# Patient Record
Sex: Female | Born: 1990 | Race: White | Hispanic: No | Marital: Married | Smoking: Never smoker
Health system: Southern US, Community
[De-identification: ages and names within clinical notes are randomized; demographics above are authoritative.]

## PROBLEM LIST (undated history)

## (undated) DIAGNOSIS — J45909 Unspecified asthma, uncomplicated: Secondary | ICD-10-CM

## (undated) DIAGNOSIS — Z9049 Acquired absence of other specified parts of digestive tract: Secondary | ICD-10-CM

## (undated) HISTORY — PX: TONSILLECTOMY: SUR1361

## (undated) HISTORY — PX: BREAST SURGERY: SHX581

## (undated) HISTORY — PX: CHOLECYSTECTOMY: SHX55

## (undated) HISTORY — DX: Acquired absence of other specified parts of digestive tract: Z90.49

## (undated) HISTORY — PX: APPENDECTOMY: SHX54

---
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
## 2008-09-20 ENCOUNTER — Encounter: Payer: Self-pay | Admitting: Maternal and Fetal Medicine

## 2008-11-11 ENCOUNTER — Ambulatory Visit: Payer: Self-pay | Admitting: Internal Medicine

## 2008-12-24 ENCOUNTER — Observation Stay: Payer: Self-pay

## 2009-03-22 ENCOUNTER — Inpatient Hospital Stay: Payer: Self-pay | Admitting: Obstetrics and Gynecology

## 2010-05-04 ENCOUNTER — Emergency Department (HOSPITAL_COMMUNITY): Admission: EM | Admit: 2010-05-04 | Discharge: 2010-05-04 | Payer: Self-pay | Admitting: Family Medicine

## 2011-07-12 ENCOUNTER — Ambulatory Visit: Payer: Self-pay

## 2012-02-15 ENCOUNTER — Ambulatory Visit: Payer: Self-pay | Admitting: Emergency Medicine

## 2012-06-21 ENCOUNTER — Ambulatory Visit: Payer: Self-pay | Admitting: Internal Medicine

## 2013-01-31 ENCOUNTER — Ambulatory Visit: Payer: Self-pay | Admitting: Emergency Medicine

## 2013-01-31 LAB — URINALYSIS, COMPLETE
Glucose,UR: NEGATIVE mg/dL (ref 0–75)
Ketone: NEGATIVE
RBC,UR: >30 /HPF (ref 0–5)
Specific Gravity: 1.02 (ref 1.003–1.030)
WBC UR: >30 /HPF (ref 0–5)

## 2013-01-31 LAB — PREGNANCY, URINE: Pregnancy Test, Urine: NEGATIVE m[IU]/mL

## 2013-02-02 LAB — URINE CULTURE

## 2013-08-06 ENCOUNTER — Observation Stay: Payer: Self-pay

## 2013-08-06 LAB — URINALYSIS, COMPLETE
Bacteria: NONE SEEN
Bilirubin,UR: NEGATIVE
Glucose,UR: NEGATIVE mg/dL (ref 0–75)
Ketone: NEGATIVE
Leukocyte Esterase: NEGATIVE
NITRITE: NEGATIVE
Ph: 6 (ref 4.5–8.0)
Protein: 100
RBC,UR: 5124 /HPF (ref 0–5)
SQUAMOUS EPITHELIAL: NONE SEEN
Specific Gravity: 1.026 (ref 1.003–1.030)

## 2013-08-08 LAB — URINE CULTURE

## 2014-04-17 ENCOUNTER — Ambulatory Visit: Payer: Self-pay | Admitting: Physician Assistant

## 2014-08-03 ENCOUNTER — Ambulatory Visit: Payer: Self-pay | Admitting: Internal Medicine

## 2015-06-26 ENCOUNTER — Ambulatory Visit
Admission: EM | Admit: 2015-06-26 | Discharge: 2015-06-26 | Disposition: A | Discharge status: Home / Self Care | Payer: Self-pay | Attending: Family Medicine | Admitting: Family Medicine

## 2015-06-26 ENCOUNTER — Encounter: Payer: Self-pay | Admitting: Emergency Medicine

## 2015-06-26 DIAGNOSIS — J209 Acute bronchitis, unspecified: Secondary | ICD-10-CM

## 2015-06-26 MED ORDER — AZITHROMYCIN 250 MG PO TABS: ORAL_TABLET | ORAL | Status: DC

## 2015-06-26 MED ORDER — ALBUTEROL SULFATE HFA 108 (90 BASE) MCG/ACT IN AERS: 1.0000 | INHALATION_SPRAY | Freq: Four times a day (QID) | RESPIRATORY_TRACT | Status: DC | PRN

## 2015-06-26 NOTE — ED Provider Notes (Signed)
Patient presents today with symptoms of nasal congestion, sore throat, productive cough (green mucus) chest discomfort with coughing, hoarseness of voice for the last 3-4 days. Patient's mother has pneumonia. Patient denies any fever but has had questionable chills. Denies any history of asthma or COPD. Denies history of smoking. Patient denies any abdominal pain, nausea, vomiting, diarrhea. Has been taking some old Cheratussin which hasn't helped much.  ROS: Negative except mentioned above.  Vitals as per Epic. GENERAL: NAD HEENT: mild pharyngeal erythema, no exudate, no erythema of TMs, no cervical LAD RESP: CTA B, no accessory muscle use CARD: RRR NEURO: CN II-XII grossly intact   A/P: Bronchitis- Will treat with Albuterol when necessary, Zithromax, Delsym when necessary, oral antihistamine when necessary, seek medical attention if symptoms persist or worsen as discussed.   Jolene ProvostKirtida Masato Pettie, MD 06/26/15 [phone]

## 2015-06-26 NOTE — ED Notes (Signed)
Pt reports nasal congestion, sore throat, chest pain, cough, hoarse voice started 3 days ago. Her mother has pneumonia. Little bit of chills yesterday, denies fever.

## 2015-06-26 NOTE — ED Notes (Signed)
Pt doesn't have insurance so hopes for inexpensive meds.

## 2015-08-07 ENCOUNTER — Ambulatory Visit
Admission: EM | Admit: 2015-08-07 | Discharge: 2015-08-07 | Disposition: A | Discharge status: Home / Self Care | Payer: Self-pay | Attending: Family Medicine | Admitting: Family Medicine

## 2015-08-07 ENCOUNTER — Encounter: Payer: Self-pay | Admitting: Gynecology

## 2015-08-07 DIAGNOSIS — J018 Other acute sinusitis: Secondary | ICD-10-CM

## 2015-08-07 DIAGNOSIS — J069 Acute upper respiratory infection, unspecified: Secondary | ICD-10-CM

## 2015-08-07 LAB — URINALYSIS COMPLETE WITH MICROSCOPIC (ARMC ONLY)
Bilirubin Urine: NEGATIVE
GLUCOSE, UA: NEGATIVE mg/dL
HGB URINE DIPSTICK: NEGATIVE
Ketones, ur: NEGATIVE mg/dL
LEUKOCYTES UA: NEGATIVE
NITRITE: NEGATIVE
Protein, ur: NEGATIVE mg/dL
SPECIFIC GRAVITY, URINE: 1.025 (ref 1.005–1.030)
pH: 6 (ref 5.0–8.0)

## 2015-08-07 LAB — RAPID STREP SCREEN (MED CTR MEBANE ONLY): Streptococcus, Group A Screen (Direct): NEGATIVE

## 2015-08-07 LAB — PREGNANCY, URINE: PREG TEST UR: NEGATIVE

## 2015-08-07 MED ORDER — FLUTICASONE PROPIONATE 50 MCG/ACT NA SUSP: 1.0000 | Freq: Every day | NASAL | Status: DC

## 2015-08-07 MED ORDER — SULFAMETHOXAZOLE-TRIMETHOPRIM 800-160 MG PO TABS: 1.0000 | ORAL_TABLET | Freq: Two times a day (BID) | ORAL | Status: AC

## 2015-08-07 NOTE — ED Notes (Addendum)
Patient c/o seen on 06/26/15 dx with mild bronchitis. Pt.now with cough/ sore throat / burning urination with cramping. Pt. Stated right eye redness/ crust / and drainage x couple days.

## 2015-08-07 NOTE — ED Provider Notes (Signed)
Patient presents today with symptoms of mild sore throat, mild productive cough, urinary frequency and slight dysuria, itchy watery eyes bilaterally for the last few days (no mattiness or discharge). Patient has had recurring symptoms listed over the last few months. She states her symptoms get better with treatment and then come back. He is unsure whether there is an allergen that is contributing to her symptoms. She denies any severe headache, fever, nausea, vomiting, diarrhea, chest pain, shortness of breath. She denies any smoking history.  ROS: Negative except mentioned above.  Vitals as per Epic.  GENERAL: NAD HEENT: mild pharyngeal erythema, no exudate, no erythema of TMs, mild cerumen bilaterally, no cervical LAD, mild frontal sinus tenderness bilaterally, minimal erythema of conjunctiva, no discharge or mattiness noted  RESP: CTA B CARD: RRR ABD: +BS, NT/ND, no rebound or guarding, no flank tenderness NEURO: CN II-XII grossly intact   A/P: URI, Sinusitis- Will treat with Bactrim DS, Flonase when necessary, Claritin prn, Tylenol/Ibuprofen when necessary, Delsym when necessary, rest, hydration. Discussed with patient that I would recommend seeing her primary care physician within the week to do any further workup regarding her recurrent illnesses. She may have allergy related symptoms and may need allergy testing. If any worsening symptoms she is to seek immediate medical attention as discussed.  Jolene ProvostKirtida Shaneeka Scarboro, MD 08/07/15 [phone]

## 2015-08-09 LAB — CULTURE, GROUP A STREP (THRC)

## 2015-08-09 LAB — URINE CULTURE

## 2015-11-11 ENCOUNTER — Ambulatory Visit (INDEPENDENT_AMBULATORY_CARE_PROVIDER_SITE_OTHER): Payer: Self-pay | Admitting: Family Medicine

## 2015-11-11 ENCOUNTER — Ambulatory Visit: Payer: Self-pay | Admitting: Family Medicine

## 2015-11-11 ENCOUNTER — Encounter: Payer: Self-pay | Admitting: Family Medicine

## 2015-11-11 VITALS — BP 100/78 | HR 80 | Temp 97.9°F | Ht 67.0 in | Wt 179.0 lb

## 2015-11-11 DIAGNOSIS — J40 Bronchitis, not specified as acute or chronic: Secondary | ICD-10-CM

## 2015-11-11 DIAGNOSIS — J0100 Acute maxillary sinusitis, unspecified: Secondary | ICD-10-CM

## 2015-11-11 MED ORDER — ALBUTEROL SULFATE HFA 108 (90 BASE) MCG/ACT IN AERS: 2.0000 | INHALATION_SPRAY | Freq: Four times a day (QID) | RESPIRATORY_TRACT | Status: DC | PRN

## 2015-11-11 MED ORDER — GUAIFENESIN-CODEINE 100-10 MG/5ML PO SYRP: 5.0000 mL | ORAL_SOLUTION | Freq: Three times a day (TID) | ORAL | Status: DC | PRN

## 2015-11-11 MED ORDER — AMOXICILLIN 500 MG PO CAPS: 500.0000 mg | ORAL_CAPSULE | Freq: Three times a day (TID) | ORAL | Status: DC

## 2015-11-11 NOTE — Progress Notes (Signed)
Name: Maria Zavala   MRN: [id number]    DOB: Nov 04, 1990   Date:11/11/2015       Progress Note  Subjective  Chief Complaint  Chief Complaint  Patient presents with  . Sinusitis    cough and cong- wheezing with green production    Sinusitis This is a new problem. The current episode started 1 to 4 weeks ago. The problem has been waxing and waning since onset. There has been no fever. She is experiencing no pain. Associated symptoms include chills, congestion, coughing, diaphoresis, headaches, a hoarse voice, shortness of breath and sinus pressure. Pertinent negatives include no ear pain, neck pain, sneezing, sore throat or swollen glands. Past treatments include oral decongestants. The treatment provided no relief.  Cough This is a new problem. The current episode started more than 1 year ago. The problem has been waxing and waning. The cough is productive of purulent sputum. Associated symptoms include chest pain, chills, headaches, nasal congestion, postnasal drip and shortness of breath. Pertinent negatives include no ear pain, fever, heartburn, myalgias, rash, sore throat, weight loss or wheezing. The symptoms are aggravated by pollens. She has tried a beta-agonist inhaler for the symptoms. The treatment provided mild relief. There is no history of asthma or environmental allergies.    No problem-specific assessment & plan notes found for this encounter.   History reviewed. No pertinent past medical history.  Past Surgical History  Procedure Laterality Date  . Tonsillectomy    . Breast surgery      fibroid removed  . Cholecystectomy      History reviewed. No pertinent family history.  Social History   Social History  . Marital Status: Single    Spouse Name: N/A  . Number of Children: N/A  . Years of Education: N/A   Occupational History  . Not on file.   Social History Main Topics  . Smoking status: Never Smoker   . Smokeless tobacco: Not on file  . Alcohol Use:  Yes  . Drug Use: Not on file  . Sexual Activity: Yes   Other Topics Concern  . Not on file   Social History Narrative    Allergies  Allergen Reactions  . Adhesive [Tape] Rash     Review of Systems  Constitutional: Positive for chills and diaphoresis. Negative for fever, weight loss and malaise/fatigue.  HENT: Positive for congestion, hoarse voice, postnasal drip and sinus pressure. Negative for ear discharge, ear pain, sneezing and sore throat.   Eyes: Negative for blurred vision.  Respiratory: Positive for cough and shortness of breath. Negative for sputum production and wheezing.   Cardiovascular: Positive for chest pain. Negative for palpitations and leg swelling.  Gastrointestinal: Negative for heartburn, nausea, abdominal pain, diarrhea, constipation, blood in stool and melena.  Genitourinary: Negative for dysuria, urgency, frequency and hematuria.  Musculoskeletal: Negative for myalgias, back pain, joint pain and neck pain.  Skin: Negative for rash.  Neurological: Positive for headaches. Negative for dizziness, tingling, sensory change and focal weakness.  Endo/Heme/Allergies: Negative for environmental allergies and polydipsia. Does not bruise/bleed easily.  Psychiatric/Behavioral: Negative for depression and suicidal ideas. The patient is not nervous/anxious and does not have insomnia.      Objective  Filed Vitals:   11/11/15 1351  BP: 100/78  Pulse: 80  Temp: 97.9 F (36.6 C)  TempSrc: Oral  Height: 5\' 7"  (1.702 m)  Weight: 179 lb (81.194 kg)  SpO2: 99%    Physical Exam  Constitutional: She is well-developed, well-nourished, and in  no distress. No distress.  HENT:  Head: Normocephalic and atraumatic.  Right Ear: External ear normal.  Left Ear: External ear normal.  Nose: Nose normal.  Mouth/Throat: Oropharynx is clear and moist.  Eyes: Conjunctivae and EOM are normal. Pupils are equal, round, and reactive to light. Right eye exhibits no discharge. Left  eye exhibits no discharge.  Neck: Normal range of motion. Neck supple. No JVD present. No thyromegaly present.  Cardiovascular: Normal rate, regular rhythm, normal heart sounds and intact distal pulses.  Exam reveals no gallop and no friction rub.   No murmur heard. Pulmonary/Chest: Effort normal and breath sounds normal.  Abdominal: Soft. Bowel sounds are normal. She exhibits no mass. There is no tenderness. There is no guarding.  Musculoskeletal: Normal range of motion. She exhibits no edema.  Lymphadenopathy:    She has no cervical adenopathy.  Neurological: She is alert. She has normal reflexes.  Skin: Skin is warm and dry. She is not diaphoretic.  Psychiatric: Mood and affect normal.  Nursing note and vitals reviewed.     Assessment & Plan  Problem List Items Addressed This Visit    None    Visit Diagnoses    Acute maxillary sinusitis, recurrence not specified    -  Primary    Relevant Medications    amoxicillin (AMOXIL) 500 MG capsule    guaiFENesin-codeine (ROBITUSSIN AC) 100-10 MG/5ML syrup    Bronchitis        symbicort sample    Relevant Medications    albuterol (PROVENTIL HFA;VENTOLIN HFA) 108 (90 Base) MCG/ACT inhaler    amoxicillin (AMOXIL) 500 MG capsule    guaiFENesin-codeine (ROBITUSSIN AC) 100-10 MG/5ML syrup         Dr. [facility]  11/11/2015

## 2015-11-11 NOTE — Patient Instructions (Signed)
How to Use an Inhaler °Proper inhaler technique is very important. Good technique ensures that the medicine reaches the lungs. Poor technique results in depositing the medicine on the tongue and back of the throat rather than in the airways. If you do not use the inhaler with good technique, the medicine will not help you. °STEPS TO FOLLOW IF USING AN INHALER WITHOUT AN EXTENSION TUBE °1. Remove the cap from the inhaler. °2. If you are using the inhaler for the first time, you will need to prime it. Shake the inhaler for 5 seconds and release four puffs into the air, away from your face. Ask your health care provider or pharmacist if you have questions about priming your inhaler. °3. Shake the inhaler for 5 seconds before each breath in (inhalation). °4. Position the inhaler so that the top of the canister faces up. °5. Put your index finger on the top of the medicine canister. Your thumb supports the bottom of the inhaler. °6. Open your mouth. °7. Either place the inhaler between your teeth and place your lips tightly around the mouthpiece, or hold the inhaler 1-2 inches away from your open mouth. If you are unsure of which technique to use, ask your health care provider. °8. Breathe out (exhale) normally and as completely as possible. °9. Press the canister down with your index finger to release the medicine. °10. At the same time as the canister is pressed, inhale deeply and slowly until your lungs are completely filled. This should take 4-6 seconds. Keep your tongue down. °11. Hold the medicine in your lungs for 5-10 seconds (10 seconds is best). This helps the medicine get into the small airways of your lungs. °12. Breathe out slowly, through pursed lips. Whistling is an example of pursed lips. °13. Wait at least 15-30 seconds between puffs. Continue with the above steps until you have taken the number of puffs your health care provider has ordered. Do not use the inhaler more than your health care provider  tells you. °14. Replace the cap on the inhaler. °15. Follow the directions from your health care provider or the inhaler insert for cleaning the inhaler. °STEPS TO FOLLOW IF USING AN INHALER WITH AN EXTENSION (SPACER) °1. Remove the cap from the inhaler. °2. If you are using the inhaler for the first time, you will need to prime it. Shake the inhaler for 5 seconds and release four puffs into the air, away from your face. Ask your health care provider or pharmacist if you have questions about priming your inhaler. °3. Shake the inhaler for 5 seconds before each breath in (inhalation). °4. Place the open end of the spacer onto the mouthpiece of the inhaler. °5. Position the inhaler so that the top of the canister faces up and the spacer mouthpiece faces you. °6. Put your index finger on the top of the medicine canister. Your thumb supports the bottom of the inhaler and the spacer. °7. Breathe out (exhale) normally and as completely as possible. °8. Immediately after exhaling, place the spacer between your teeth and into your mouth. Close your lips tightly around the spacer. °9. Press the canister down with your index finger to release the medicine. °10. At the same time as the canister is pressed, inhale deeply and slowly until your lungs are completely filled. This should take 4-6 seconds. Keep your tongue down and out of the way. °11. Hold the medicine in your lungs for 5-10 seconds (10 seconds is best). This helps the   medicine get into the small airways of your lungs. Exhale. °12. Repeat inhaling deeply through the spacer mouthpiece. Again hold that breath for up to 10 seconds (10 seconds is best). Exhale slowly. If it is difficult to take this second deep breath through the spacer, breathe normally several times through the spacer. Remove the spacer from your mouth. °13. Wait at least 15-30 seconds between puffs. Continue with the above steps until you have taken the number of puffs your health care provider has  ordered. Do not use the inhaler more than your health care provider tells you. °14. Remove the spacer from the inhaler, and place the cap on the inhaler. °15. Follow the directions from your health care provider or the inhaler insert for cleaning the inhaler and spacer. °If you are using different kinds of inhalers, use your quick relief medicine to open the airways 10-15 minutes before using a steroid if instructed to do so by your health care provider. If you are unsure which inhalers to use and the order of using them, ask your health care provider, nurse, or respiratory therapist. °If you are using a steroid inhaler, always rinse your mouth with water after your last puff, then gargle and spit out the water. Do not swallow the water. °AVOID: °· Inhaling before or after starting the spray of medicine. It takes practice to coordinate your breathing with triggering the spray. °· Inhaling through the nose (rather than the mouth) when triggering the spray. °HOW TO DETERMINE IF YOUR INHALER IS FULL OR NEARLY EMPTY °You cannot know when an inhaler is empty by shaking it. A few inhalers are now being made with dose counters. Ask your health care provider for a prescription that has a dose counter if you feel you need that extra help. If your inhaler does not have a counter, ask your health care provider to help you determine the date you need to refill your inhaler. Write the refill date on a calendar or your inhaler canister. Refill your inhaler 7-10 days before it runs out. Be sure to keep an adequate supply of medicine. This includes making sure it is not expired, and that you have a spare inhaler.  °SEEK MEDICAL CARE IF:  °· Your symptoms are only partially relieved with your inhaler. °· You are having trouble using your inhaler. °· You have some increase in phlegm. °SEEK IMMEDIATE MEDICAL CARE IF:  °· You feel little or no relief with your inhalers. You are still wheezing and are feeling shortness of breath or  tightness in your chest or both. °· You have dizziness, headaches, or a fast heart rate. °· You have chills, fever, or night sweats. °· You have a noticeable increase in phlegm production, or there is blood in the phlegm. °MAKE SURE YOU:  °· Understand these instructions. °· Will watch your condition. °· Will get help right away if you are not doing well or get worse. °  °This information is not intended to replace advice given to you by your health care provider. Make sure you discuss any questions you have with your health care provider. °  °Document Released: 07/20/2000 Document Revised: 05/13/2013 Document Reviewed: 02/19/2013 °Elsevier Interactive Patient Education ©2016 [employer] ° °

## 2016-01-02 ENCOUNTER — Encounter: Payer: Self-pay | Admitting: *Deleted

## 2016-01-02 ENCOUNTER — Ambulatory Visit
Admission: EM | Admit: 2016-01-02 | Discharge: 2016-01-02 | Disposition: A | Discharge status: Home / Self Care | Payer: Self-pay | Attending: Family Medicine | Admitting: Family Medicine

## 2016-01-02 ENCOUNTER — Ambulatory Visit (INDEPENDENT_AMBULATORY_CARE_PROVIDER_SITE_OTHER): Payer: Self-pay

## 2016-01-02 DIAGNOSIS — J189 Pneumonia, unspecified organism: Secondary | ICD-10-CM

## 2016-01-02 DIAGNOSIS — J181 Lobar pneumonia, unspecified organism: Principal | ICD-10-CM

## 2016-01-02 MED ORDER — LEVOFLOXACIN 500 MG PO TABS: 500.0000 mg | ORAL_TABLET | Freq: Every day | ORAL | Status: DC

## 2016-01-02 MED ORDER — GUAIFENESIN-CODEINE 100-10 MG/5ML PO SOLN: ORAL | Status: DC

## 2016-01-02 MED ORDER — IPRATROPIUM-ALBUTEROL 0.5-2.5 (3) MG/3ML IN SOLN
3.0000 mL | Freq: Once | RESPIRATORY_TRACT | Status: AC
  Administered 2016-01-02: 3 mL via RESPIRATORY_TRACT

## 2016-01-02 NOTE — Discharge Instructions (Signed)

## 2016-01-02 NOTE — ED Provider Notes (Signed)
CSN: [id number]     Arrival date & time 01/02/16  0946 History   First MD Initiated Contact with Patient 01/02/16 1049     Chief Complaint  Patient presents with  . Cough  . Fever  . Shortness of Breath  . Headache  . Nasal Congestion   (Consider location/radiation/quality/duration/timing/severity/associated sxs/prior Treatment) Patient is a 25 y.o. female presenting with URI. The history is provided by the patient.  URI Presenting symptoms: congestion, cough, fatigue and fever   Severity:  Moderate Onset quality:  Sudden Duration:  1 week Timing:  Constant Progression:  Worsening Chronicity:  New Relieved by:  Nothing Ineffective treatments:  OTC medications Associated symptoms: headaches and wheezing   Risk factors: not elderly, no chronic cardiac disease, no chronic kidney disease, no diabetes mellitus, no immunosuppression, no recent illness and no recent travel     History reviewed. No pertinent past medical history. Past Surgical History  Procedure Laterality Date  . Tonsillectomy    . Breast surgery      fibroid removed  . Cholecystectomy     History reviewed. No pertinent family history. Social History  Substance Use Topics  . Smoking status: Never Smoker   . Smokeless tobacco: None  . Alcohol Use: Yes   OB History    No data available     Review of Systems  Constitutional: Positive for fever and fatigue.  HENT: Positive for congestion.   Respiratory: Positive for cough and wheezing.   Neurological: Positive for headaches.    Allergies  Adhesive  Home Medications   Prior to Admission medications   Medication Sig Start Date End Date Taking? Authorizing Provider  amoxicillin (AMOXIL) 500 MG capsule Take 1 capsule (500 mg total) by mouth 3 (three) times daily. 11/11/15  Yes Duanne Limerickeanna C Jones, MD  budesonide-formoterol (SYMBICORT) 160-4.5 MCG/ACT inhaler Inhale 2 puffs into the lungs 2 (two) times daily.   Yes Historical Provider, MD  albuterol (PROVENTIL  HFA;VENTOLIN HFA) 108 (90 BASE) MCG/ACT inhaler Inhale 1-2 puffs into the lungs every 6 (six) hours as needed for wheezing or shortness of breath. 06/26/15   Jolene ProvostKirtida Patel, MD  albuterol (PROVENTIL HFA;VENTOLIN HFA) 108 (90 Base) MCG/ACT inhaler Inhale 2 puffs into the lungs every 6 (six) hours as needed for wheezing or shortness of breath. 11/11/15   Duanne Limerickeanna C Jones, MD  guaiFENesin-codeine 100-10 MG/5ML syrup 10 ml po q 8 hours prn 01/02/16   Payton Mccallumrlando Pernie Grosso, MD  levofloxacin (LEVAQUIN) 500 MG tablet Take 1 tablet (500 mg total) by mouth daily. 01/02/16   Payton Mccallumrlando Jalayia Bagheri, MD   Meds Ordered and Administered this Visit   Medications  ipratropium-albuterol (DUONEB) 0.5-2.5 (3) MG/3ML nebulizer solution 3 mL (3 mLs Nebulization Given 01/02/16 1058)    BP 110/75 mmHg  Pulse 122  Temp(Src) 99.1 F (37.3 C) (Oral)  Resp 20  Ht 5\' 7"  (1.702 m)  Wt 180 lb (81.647 kg)  BMI 28.19 kg/m2  SpO2 95%  LMP 12/26/2015 (Approximate) No data found.   Physical Exam  Constitutional: She appears well-developed and well-nourished. No distress.  HENT:  Head: Normocephalic and atraumatic.  Right Ear: Tympanic membrane, external ear and ear canal normal.  Left Ear: Tympanic membrane, external ear and ear canal normal.  Nose: Mucosal edema and rhinorrhea present. No nose lacerations, sinus tenderness, nasal deformity, septal deviation or nasal septal hematoma. No epistaxis.  No foreign bodies. Right sinus exhibits maxillary sinus tenderness and frontal sinus tenderness. Left sinus exhibits maxillary sinus tenderness and frontal sinus tenderness.  Mouth/Throat: Uvula is midline, oropharynx is clear and moist and mucous membranes are normal. No oropharyngeal exudate.  Eyes: Conjunctivae and EOM are normal. Pupils are equal, round, and reactive to light. Right eye exhibits no discharge. Left eye exhibits no discharge. No scleral icterus.  Neck: Normal range of motion. Neck supple. No thyromegaly present.  Cardiovascular:  Normal rate, regular rhythm and normal heart sounds.   Pulmonary/Chest: Effort normal. No respiratory distress. She has wheezes (diffuse expiratory). She has rales (left base).  Lymphadenopathy:    She has no cervical adenopathy.  Skin: No rash noted. She is not diaphoretic.  Nursing note and vitals reviewed.   ED Course  Procedures (including critical care time)  Labs Review Labs Reviewed - No data to display  Imaging Review Dg Chest 2 View  01/02/2016  CLINICAL DATA:  Cough and right-sided chest pain EXAM: CHEST  2 VIEW COMPARISON:  None. FINDINGS: Cardiac shadow is within normal limits. The lungs are well aerated bilaterally. Minimal left basilar infiltrate is seen. No bony abnormality is noted. No sizable effusion is seen. IMPRESSION: Minimal left basilar infiltrate. Electronically Signed   By: Alcide Clever M.D.   On: 01/02/2016 11:34     Visual Acuity Review  Right Eye Distance:   Left Eye Distance:   Bilateral Distance:    Right Eye Near:   Left Eye Near:    Bilateral Near:         MDM   1. Left lower lobe pneumonia    Discharge Medication List as of 01/02/2016 11:47 AM    START taking these medications   Details  levofloxacin (LEVAQUIN) 500 MG tablet Take 1 tablet (500 mg total) by mouth daily., Starting 01/02/2016, Until Discontinued, Normal       1. x-ray results and diagnosis reviewed with patient; patient given duoneb treatment x 1 with improvement of symptoms; recheck 02 sat 95-97% on RA sustained 2. rx as per orders above; reviewed possible side effects, interactions, risks and benefits; continue current home medications 3. Recommend supportive treatment with fluids, rest 4. Follow-up prn if symptoms worsen or don't improve    Payton Mccallum, MD 01/02/16 1845

## 2016-01-02 NOTE — ED Notes (Signed)
Productive cough- green, fever, dyspnea, headache, runny nose, head congestion, x1 week.

## 2016-10-24 ENCOUNTER — Ambulatory Visit
Admission: EM | Admit: 2016-10-24 | Discharge: 2016-10-24 | Disposition: A | Discharge status: Home / Self Care | Payer: Managed Care, Other (non HMO) | Attending: Family Medicine | Admitting: Family Medicine

## 2016-10-24 ENCOUNTER — Encounter: Payer: Self-pay | Admitting: *Deleted

## 2016-10-24 DIAGNOSIS — J4520 Mild intermittent asthma, uncomplicated: Secondary | ICD-10-CM | POA: Diagnosis not present

## 2016-10-24 HISTORY — DX: Unspecified asthma, uncomplicated: J45.909

## 2016-10-24 MED ORDER — HYDROCOD POLST-CPM POLST ER 10-8 MG/5ML PO SUER: 5.0000 mL | Freq: Two times a day (BID) | ORAL | 0 refills | Status: DC | PRN

## 2016-10-24 MED ORDER — ALBUTEROL SULFATE HFA 108 (90 BASE) MCG/ACT IN AERS: 1.0000 | INHALATION_SPRAY | Freq: Four times a day (QID) | RESPIRATORY_TRACT | 0 refills | Status: DC | PRN

## 2016-10-24 MED ORDER — AZITHROMYCIN 250 MG PO TABS: ORAL_TABLET | ORAL | 0 refills | Status: DC

## 2016-10-24 MED ORDER — PREDNISONE 20 MG PO TABS: 20.0000 mg | ORAL_TABLET | Freq: Every day | ORAL | 0 refills | Status: DC

## 2016-10-24 NOTE — ED Triage Notes (Signed)
Patient has had chronic right upper chest pain that started 2 years ago. Today the pain radiated to her back and started to cough while singing at church.

## 2016-10-24 NOTE — ED Provider Notes (Signed)
[facility]    CSN: [id number] Arrival date & time: 10/24/16  1946     History   Chief Complaint Chief Complaint  Patient presents with  . Cough  . Nasal Congestion  . Chest Pain    HPI Maria Zavala is a 26 y.o. female.    Cough  Associated symptoms: chest pain, rhinorrhea and wheezing   Associated symptoms: no headaches   Chest Pain  Pain location:  R lateral chest Pain quality: stabbing   Pain radiates to:  Upper back Pain severity:  Moderate Timing:  Intermittent Progression:  Waxing and waning Chronicity:  Chronic Associated symptoms: cough   Associated symptoms: no headache   URI  Presenting symptoms: congestion, cough and rhinorrhea   Severity:  Moderate Onset quality:  Sudden Duration:  2 weeks Timing:  Constant Chronicity:  New Relieved by:  Nothing Ineffective treatments:  OTC medications Associated symptoms: wheezing   Associated symptoms: no headaches, no neck pain and no sinus pain   Risk factors: chronic respiratory disease (asthma)   Risk factors: not elderly, no chronic cardiac disease, no chronic kidney disease, no diabetes mellitus, no immunosuppression, no recent illness, no recent travel and no sick contacts     Past Medical History:  Diagnosis Date  . Asthma     There are no active problems to display for this patient.   Past Surgical History:  Procedure Laterality Date  . BREAST SURGERY     fibroid removed  . CHOLECYSTECTOMY    . TONSILLECTOMY      OB History    No data available       Home Medications    Prior to Admission medications   Medication Sig Start Date End Date Taking? Authorizing Provider  albuterol (PROVENTIL HFA;VENTOLIN HFA) 108 (90 Base) MCG/ACT inhaler Inhale 1-2 puffs into the lungs every 6 (six) hours as needed for wheezing or shortness of breath. 10/24/16   Payton Mccallum, MD  amoxicillin (AMOXIL) 500 MG capsule Take 1 capsule (500 mg total) by mouth 3 (three) times daily. 11/11/15   Duanne Limerick, MD  azithromycin (ZITHROMAX Z-PAK) 250 MG tablet 2 tabs po once day 1, then 1 tab po qd for the next 4 days 10/24/16   Payton Mccallum, MD  budesonide-formoterol [facility]) 160-4.5 MCG/ACT inhaler Inhale 2 puffs into the lungs 2 (two) times daily.    Historical Provider, MD  chlorpheniramine-HYDROcodone (TUSSIONEX PENNKINETIC ER) 10-8 MG/5ML SUER Take 5 mLs by mouth every 12 (twelve) hours as needed. 10/24/16   Payton Mccallum, MD  guaiFENesin-codeine 100-10 MG/5ML syrup 10 ml po q 8 hours prn 01/02/16   Payton Mccallum, MD  levofloxacin (LEVAQUIN) 500 MG tablet Take 1 tablet (500 mg total) by mouth daily. 01/02/16   Payton Mccallum, MD  predniSONE (DELTASONE) 20 MG tablet Take 1 tablet (20 mg total) by mouth daily. 10/24/16   Payton Mccallum, MD    Family History History reviewed. No pertinent family history.  Social History Social History  Substance Use Topics  . Smoking status: Never Smoker  . Smokeless tobacco: Never Used  . Alcohol use Yes     Allergies   Adhesive [tape]   Review of Systems Review of Systems  HENT: Positive for congestion and rhinorrhea. Negative for sinus pain.   Respiratory: Positive for cough and wheezing.   Cardiovascular: Positive for chest pain.  Musculoskeletal: Negative for neck pain.  Neurological: Negative for headaches.     Physical Exam Triage Vital Signs ED Triage Vitals  Enc Vitals Group     BP 10/24/16 2000 111/86     Pulse Rate 10/24/16 2000 94     Resp 10/24/16 2000 16     Temp 10/24/16 2000 98.9 F (37.2 C)     Temp Source 10/24/16 2000 Oral     SpO2 10/24/16 2000 100 %     Weight 10/24/16 2001 194 lb (88 kg)     Height 10/24/16 2001 5\' 7"  (1.702 m)     Head Circumference --      Peak Flow --      Pain Score 10/24/16 2003 8     Pain Loc --      Pain Edu? --      Excl. in GC? --    No data found.   Updated Vital Signs BP 111/86 (BP Location: Left Arm)   Pulse 94   Temp 98.9 F (37.2 C) (Oral)   Resp 16   Ht 5\' 7"  (1.702  m)   Wt 194 lb (88 kg)   LMP 09/13/2016   SpO2 100%   BMI 30.38 kg/m   Visual Acuity Right Eye Distance:   Left Eye Distance:   Bilateral Distance:    Right Eye Near:   Left Eye Near:    Bilateral Near:     Physical Exam  Constitutional: She appears well-developed and well-nourished. No distress.  HENT:  Head: Normocephalic and atraumatic.  Right Ear: Tympanic membrane, external ear and ear canal normal.  Left Ear: Tympanic membrane, external ear and ear canal normal.  Nose: Mucosal edema and rhinorrhea present. No nose lacerations, sinus tenderness, nasal deformity, septal deviation or nasal septal hematoma. No epistaxis.  No foreign bodies. Right sinus exhibits maxillary sinus tenderness and frontal sinus tenderness. Left sinus exhibits maxillary sinus tenderness and frontal sinus tenderness.  Mouth/Throat: Uvula is midline, oropharynx is clear and moist and mucous membranes are normal. No oropharyngeal exudate.  Eyes: Conjunctivae and EOM are normal. Pupils are equal, round, and reactive to light. Right eye exhibits no discharge. Left eye exhibits no discharge. No scleral icterus.  Neck: Normal range of motion. Neck supple. No thyromegaly present.  Cardiovascular: Normal rate, regular rhythm and normal heart sounds.   Pulmonary/Chest: Effort normal. No respiratory distress. She has wheezes (diffuse, mild). She has no rales.  Lymphadenopathy:    She has no cervical adenopathy.  Skin: She is not diaphoretic.  Nursing note and vitals reviewed.    UC Treatments / Results  Labs (all labs ordered are listed, but only abnormal results are displayed) Labs Reviewed - No data to display  EKG  EKG Interpretation None       Radiology No results found.  Procedures Procedures (including critical care time)  Medications Ordered in UC Medications - No data to display   Initial Impression / Assessment and Plan / UC Course  I have reviewed the triage vital signs and the  nursing notes.  Pertinent labs & imaging results that were available during my care of the patient were reviewed by me and considered in my medical decision making (see chart for details).       Final Clinical Impressions(s) / UC Diagnoses   Final diagnoses:  Mild intermittent asthmatic bronchitis without complication    New Prescriptions Discharge Medication List as of 10/24/2016  8:33 PM    START taking these medications   Details  azithromycin (ZITHROMAX Z-PAK) 250 MG tablet 2 tabs po once day 1, then 1 tab po qd for the next  4 days, Normal    chlorpheniramine-HYDROcodone (TUSSIONEX PENNKINETIC ER) 10-8 MG/5ML SUER Take 5 mLs by mouth every 12 (twelve) hours as needed., Starting Wed 10/24/2016, Normal    predniSONE (DELTASONE) 20 MG tablet Take 1 tablet (20 mg total) by mouth daily., Starting Wed 10/24/2016, Normal       1. diagnosis reviewed with patient 2. rx as per orders above; reviewed possible side effects, interactions, risks and benefits  3. Recommend supportive treatment with rest, fluids 4. Follow-up prn if symptoms worsen or don't improve   Payton Mccallumrlando Cole Klugh, MD 10/24/16 2057

## 2016-11-11 DIAGNOSIS — Z9049 Acquired absence of other specified parts of digestive tract: Secondary | ICD-10-CM

## 2016-11-11 HISTORY — DX: Acquired absence of other specified parts of digestive tract: Z90.49

## 2016-11-16 ENCOUNTER — Ambulatory Visit: Payer: Self-pay | Admitting: Family Medicine

## 2017-01-03 ENCOUNTER — Encounter: Payer: Self-pay | Admitting: Family Medicine

## 2017-01-03 ENCOUNTER — Ambulatory Visit (INDEPENDENT_AMBULATORY_CARE_PROVIDER_SITE_OTHER): Payer: Managed Care, Other (non HMO) | Admitting: Family Medicine

## 2017-01-03 VITALS — BP 108/74 | HR 84 | Ht 67.0 in | Wt 193.0 lb

## 2017-01-03 DIAGNOSIS — F329 Major depressive disorder, single episode, unspecified: Secondary | ICD-10-CM

## 2017-01-03 DIAGNOSIS — F419 Anxiety disorder, unspecified: Secondary | ICD-10-CM

## 2017-01-03 MED ORDER — SERTRALINE HCL 25 MG PO TABS: 25.0000 mg | ORAL_TABLET | Freq: Every day | ORAL | 2 refills | Status: DC

## 2017-01-03 NOTE — Progress Notes (Signed)
Name: Maria Zavala   MRN: [id number]    DOB: [birth year]   Date:01/03/2017       Progress Note  Subjective  Chief Complaint  Chief Complaint  Patient presents with  . Depression    husband works 2 days a week- money issues    Patient having issues with anxiety and depression. On condoms for birth controlled   Depression         This is a new problem.  The current episode started more than 1 month ago.   The onset quality is gradual.   The problem occurs daily.  The problem has been gradually worsening since onset.  Associated symptoms include helplessness, irritable, decreased interest and sad.  Associated symptoms include no decreased concentration, no fatigue, no hopelessness, does not have insomnia, no restlessness, no appetite change, no body aches, no myalgias, no headaches, no indigestion and no suicidal ideas.     The symptoms are aggravated by family issues.  Past treatments include SSRIs - Selective serotonin reuptake inhibitors.  Compliance with treatment is good.  Past medical history includes anxiety.   Anxiety  Presents for initial visit. Patient reports no chest pain, decreased concentration, dizziness, insomnia, nausea, nervous/anxious behavior, palpitations, restlessness, shortness of breath or suicidal ideas. The severity of symptoms is moderate. The symptoms are aggravated by family issues.      No problem-specific Assessment & Plan notes found for this encounter.   Past Medical History:  Diagnosis Date  . Asthma   . Status post appendectomy 11/11/2016    Past Surgical History:  Procedure Laterality Date  . BREAST SURGERY     fibroid removed  . CHOLECYSTECTOMY    . TONSILLECTOMY      History reviewed. No pertinent family history.  Social History   Social History  . Marital status: Single    Spouse name: N/A  . Number of children: N/A  . Years of education: N/A   Occupational History  . Not on file.   Social History Main Topics  . Smoking status:  Never Smoker  . Smokeless tobacco: Never Used  . Alcohol use Yes  . Drug use: No  . Sexual activity: Yes   Other Topics Concern  . Not on file   Social History Narrative  . No narrative on file    Allergies  Allergen Reactions  . Adhesive [Tape] Rash    Outpatient Medications Prior to Visit  Medication Sig Dispense Refill  . budesonide-formoterol (SYMBICORT) 160-4.5 MCG/ACT inhaler Inhale 2 puffs into the lungs 2 (two) times daily.    Marland Kitchen albuterol (PROVENTIL HFA;VENTOLIN HFA) 108 (90 Base) MCG/ACT inhaler Inhale 1-2 puffs into the lungs every 6 (six) hours as needed for wheezing or shortness of breath. 1 Inhaler 0  . amoxicillin (AMOXIL) 500 MG capsule Take 1 capsule (500 mg total) by mouth 3 (three) times daily. 30 capsule 0  . azithromycin (ZITHROMAX Z-PAK) 250 MG tablet 2 tabs po once day 1, then 1 tab po qd for the next 4 days 6 each 0  . chlorpheniramine-HYDROcodone (TUSSIONEX PENNKINETIC ER) 10-8 MG/5ML SUER Take 5 mLs by mouth every 12 (twelve) hours as needed. 120 mL 0  . guaiFENesin-codeine 100-10 MG/5ML syrup 10 ml po q 8 hours prn 120 mL 0  . levofloxacin (LEVAQUIN) 500 MG tablet Take 1 tablet (500 mg total) by mouth daily. 7 tablet 0  . predniSONE (DELTASONE) 20 MG tablet Take 1 tablet (20 mg total) by mouth daily. 7 tablet 0   No  facility-administered medications prior to visit.     Review of Systems  Constitutional: Negative for appetite change, chills, fatigue, fever, malaise/fatigue and weight loss.  HENT: Negative for ear discharge, ear pain and sore throat.   Eyes: Negative for blurred vision.  Respiratory: Negative for cough, sputum production, shortness of breath and wheezing.   Cardiovascular: Negative for chest pain, palpitations and leg swelling.  Gastrointestinal: Negative for abdominal pain, blood in stool, constipation, diarrhea, heartburn, melena and nausea.  Genitourinary: Negative for dysuria, frequency, hematuria and urgency.  Musculoskeletal:  Negative for back pain, joint pain, myalgias and neck pain.  Skin: Negative for rash.  Neurological: Negative for dizziness, tingling, sensory change, focal weakness and headaches.  Endo/Heme/Allergies: Negative for environmental allergies and polydipsia. Does not bruise/bleed easily.  Psychiatric/Behavioral: Positive for depression. Negative for decreased concentration and suicidal ideas. The patient is not nervous/anxious and does not have insomnia.      Objective  Vitals:   01/03/17 0856  BP: 108/74  Pulse: 84  SpO2: 98%  Weight: 193 lb (87.5 kg)  Height: 5\' 7"  (1.702 m)    Physical Exam  Constitutional: She is well-developed, well-nourished, and in no distress. She is irritable. No distress.  HENT:  Head: Normocephalic and atraumatic.  Right Ear: External ear normal.  Left Ear: External ear normal.  Nose: Nose normal.  Mouth/Throat: Oropharynx is clear and moist.  Eyes: Conjunctivae and EOM are normal. Pupils are equal, round, and reactive to light. Right eye exhibits no discharge. Left eye exhibits no discharge.  Neck: Normal range of motion. Neck supple. No JVD present. No thyromegaly present.  Cardiovascular: Normal rate, regular rhythm, normal heart sounds and intact distal pulses.  Exam reveals no gallop and no friction rub.   No murmur heard. Pulmonary/Chest: Effort normal and breath sounds normal. She has no wheezes. She has no rales.  Abdominal: Soft. Bowel sounds are normal. She exhibits no mass. There is no tenderness. There is no guarding.  Musculoskeletal: Normal range of motion. She exhibits no edema.  Lymphadenopathy:    She has no cervical adenopathy.  Neurological: She is alert. She has normal reflexes.  Skin: Skin is warm and dry. She is not diaphoretic.  Psychiatric: Mood and affect normal.  Nursing note and vitals reviewed.     Assessment & Plan  Problem List Items Addressed This Visit    None    Visit Diagnoses    Anxiety and depression    -   Primary   Relevant Medications   sertraline (ZOLOFT) 25 MG tablet      Meds ordered this encounter  Medications  . sertraline (ZOLOFT) 25 MG tablet    Sig: Take 1 tablet (25 mg total) by mouth daily.    Dispense:  30 tablet    Refill:  2      Dr. Hayden Rasmusseneanna Brazil [facility]  01/03/17

## 2017-02-04 ENCOUNTER — Encounter: Payer: Self-pay | Admitting: Family Medicine

## 2017-02-04 ENCOUNTER — Ambulatory Visit (INDEPENDENT_AMBULATORY_CARE_PROVIDER_SITE_OTHER): Payer: Managed Care, Other (non HMO) | Admitting: Family Medicine

## 2017-02-04 VITALS — BP 140/92 | HR 80 | Ht 67.0 in | Wt 192.0 lb

## 2017-02-04 DIAGNOSIS — F329 Major depressive disorder, single episode, unspecified: Secondary | ICD-10-CM

## 2017-02-04 DIAGNOSIS — T148XXA Other injury of unspecified body region, initial encounter: Secondary | ICD-10-CM

## 2017-02-04 DIAGNOSIS — F419 Anxiety disorder, unspecified: Secondary | ICD-10-CM | POA: Diagnosis not present

## 2017-02-04 DIAGNOSIS — N912 Amenorrhea, unspecified: Secondary | ICD-10-CM | POA: Diagnosis not present

## 2017-02-04 LAB — POCT URINE PREGNANCY: Preg Test, Ur: NEGATIVE

## 2017-02-04 MED ORDER — MUPIROCIN 2 % EX OINT: 1.0000 "application " | TOPICAL_OINTMENT | Freq: Two times a day (BID) | CUTANEOUS | 0 refills | Status: DC

## 2017-02-04 MED ORDER — SERTRALINE HCL 50 MG PO TABS: 50.0000 mg | ORAL_TABLET | Freq: Every day | ORAL | 3 refills | Status: DC

## 2017-02-04 NOTE — Patient Instructions (Signed)
Deep Skin Avulsion A deep skin avulsion is a type of open wound. It often results from a severe injury (trauma) that tears away all layers of the skin or an entire body part. The areas of the body that are most often affected by a deep skin avulsion include the face, lips, ears, nose, and fingers. A deep skin avulsion may make structures below the skin become visible. You may be able to see muscle, bone, nerves, and blood vessels. A deep skin avulsion can also damage important structures beneath the skin. These include tendons, ligaments, nerves, or blood vessels. What are the causes? Injuries that often cause a deep skin avulsion include:  Being crushed.  Falling against a jagged surface.  Animal bites.  Gunshot wounds.  Severe burns.  Injuries that involve being dragged, such as bicycle or motorcycle accidents.  What are the signs or symptoms? Symptoms of a deep skin avulsion include:  Pain.  Numbness.  Swelling.  A misshapen body part.  Bleeding, which may be heavy.  Fluid leaking from the wound.  How is this diagnosed? This condition may be diagnosed with a medical history and physical exam. You may also have X-rays done. How is this treated? The treatment that is chosen for a deep skin avulsion depends on how large and deep the wound is and where it is located. Treatment for all types of avulsions usually starts with:  Controlling the bleeding.  Washing out the wound with a germ-free (sterile) salt-water solution.  Removing dead tissue from the wound.  A wound may be closed or left open to heal. This depends on the size and location of the wound and whether it is likely to become infected. Wounds are usually covered or closed if they expose blood vessels, nerves, bone, or cartilage.  Wounds that are small and clean may be closed with stitches (sutures).  Wounds that cannot be closed with sutures may be covered with a piece of skin (graft) or a skin flap. Skin may  be taken from on or near the wound, from another part of the body, or from a donor.  Wounds may be left open if they are hard to close or they may become infected. These wounds heal over time from the bottom up.  You may also receive medicine. This may include:  Antibiotics.  A tetanus shot.  Rabies vaccine.  Follow these instructions at home: Medicines  Take or apply over-the-counter and prescription medicines only as told by your health care provider.  If you were prescribed an antibiotic, take or apply it as told by your health care provider. Do not stop taking the antibiotic even if your condition improves.  You may get anti-itch medicine while your wound is healing. Use it only as told by your health care provider. Wound care  There are many ways to close and cover a wound. For example, a wound can be covered with sutures, skin glue, or adhesive strips. Follow instructions from your health care provider about: ? How to take care of your wound. ? When and how you should change your bandage (dressing). ? When you should remove your dressing. ? Removing whatever was used to close your wound.  Keep the dressing dry as told by your health care provider. Do not take baths, swim, use a hot tub, or do anything that would put your wound underwater until your health care provider approves.  Clean the wound each day or as told by your health care provider. ? Wash   the wound with mild soap and water. ? Rinse the wound with water to remove all soap. ? Pat the wound dry with a clean towel. Do not rub it.  Do not scratch or pick at the wound.  Check your wound every day for signs of infection. Watch for: ? Redness, swelling, or pain. ? Fluid, blood, or pus. General instructions  Raise (elevate) the injured area above the level of your heart while you are sitting or lying down.  Keep all follow-up visits as told by your health care provider. This is important. Contact a health care  provider if:  You received a tetanus shot and you have swelling, severe pain, redness, or bleeding at the injection site.  You have a fever.  Your pain is not controlled with medicine.  You have increased redness, swelling, or pain at the site of your wound.  You have fluid, blood, or pus coming from your wound.  You notice a bad smell coming from your wound or your dressing.  A wound that was closed breaks open.  You notice something coming out of the wound, such as wood or glass.  You notice a change in the color of your skin near your wound.  You develop a new rash.  You need to change the dressing frequently due to fluid, blood, or pus draining from the wound. Get help right away if:  Your pain suddenly increases and is severe.  You develop severe swelling around the wound.  You develop numbness around the wound.  You have nausea and vomiting that does not go away after 24 hours.  You feel light-headed, weak, or faint.  You develop chest pain.  You have trouble breathing.  Your wound is on your hand or foot and you cannot properly move a finger or toe.  The wound is on your hand or foot and you notice that your fingers or toes look pale or bluish.  You have a red streak going away from your wound. This information is not intended to replace advice given to you by your health care provider. Make sure you discuss any questions you have with your health care provider. Document Released: 09/18/2006 Document Revised: 12/29/2015 Document Reviewed: 07/28/2014 [employer] Interactive Patient Education  2018 [employer]  

## 2017-02-04 NOTE — Progress Notes (Signed)
Name: Maria Zavala   MRN: [id number]    DOB: 02-10-1991   Date:02/04/2017       Progress Note  Subjective  Chief Complaint  Chief Complaint  Patient presents with  . Follow-up    somewhat better- checking a pregnancy test due to missing 3 months of periods. "Not unusual for me to miss periods"    Depression         This is a chronic problem.  The current episode started more than 1 month ago.   The onset quality is gradual.   The problem occurs intermittently.  The problem has been gradually improving since onset.  Associated symptoms include sad.  Associated symptoms include no decreased concentration, no fatigue (but not at goal), no helplessness, no hopelessness, does not have insomnia, not irritable, no decreased interest, no myalgias, no headaches and no suicidal ideas.  Past treatments include SSRIs - Selective serotonin reuptake inhibitors.  Compliance with treatment is good.  Previous treatment provided mild relief. Laceration   The incident occurred 3 to 5 days ago. The laceration is located on the left leg. Injury mechanism: scratch.    No problem-specific Assessment & Plan notes found for this encounter.   Past Medical History:  Diagnosis Date  . Asthma   . Status post appendectomy 11/11/2016    Past Surgical History:  Procedure Laterality Date  . BREAST SURGERY     fibroid removed  . CHOLECYSTECTOMY    . TONSILLECTOMY      No family history on file.  Social History   Social History  . Marital status: Single    Spouse name: N/A  . Number of children: N/A  . Years of education: N/A   Occupational History  . Not on file.   Social History Main Topics  . Smoking status: Never Smoker  . Smokeless tobacco: Never Used  . Alcohol use Yes  . Drug use: No  . Sexual activity: Yes   Other Topics Concern  . Not on file   Social History Narrative  . No narrative on file    Allergies  Allergen Reactions  . Adhesive [Tape] Rash    Outpatient Medications  Prior to Visit  Medication Sig Dispense Refill  . albuterol (PROAIR HFA) 108 (90 Base) MCG/ACT inhaler INHALE 1-2 PUFFS INTO THE LUNGS EVERY 6 (SIX) HOURS AS NEEDED FOR WHEEZING OR SHORTNESS OF BREATH.    . budesonide-formoterol (SYMBICORT) 160-4.5 MCG/ACT inhaler Inhale 2 puffs into the lungs 2 (two) times daily.    . sertraline (ZOLOFT) 25 MG tablet Take 1 tablet (25 mg total) by mouth daily. 30 tablet 2  . oxyCODONE (OXY IR/ROXICODONE) 5 MG immediate release tablet TAKE 1 TABLET (5 MG TOTAL) BY MOUTH EVERY SIX (6) HOURS AS NEEDED FOR PAIN.     No facility-administered medications prior to visit.     Review of Systems  Constitutional: Positive for malaise/fatigue. Negative for chills, fatigue (but not at goal), fever and weight loss.  HENT: Negative for ear discharge, ear pain and sore throat.   Eyes: Negative for blurred vision.  Respiratory: Negative for cough, sputum production, shortness of breath and wheezing.   Cardiovascular: Negative for chest pain, palpitations and leg swelling.  Gastrointestinal: Negative for abdominal pain, blood in stool, constipation, diarrhea, heartburn, melena and nausea.  Genitourinary: Negative for dysuria, frequency, hematuria and urgency.  Musculoskeletal: Negative for back pain, joint pain, myalgias and neck pain.  Skin: Negative for rash.  Neurological: Negative for dizziness, tingling, sensory change, focal  weakness and headaches.  Endo/Heme/Allergies: Negative for environmental allergies and polydipsia. Does not bruise/bleed easily.  Psychiatric/Behavioral: Positive for depression. Negative for decreased concentration and suicidal ideas. The patient is not nervous/anxious and does not have insomnia.      Objective  Vitals:   02/04/17 1525  BP: (!) 140/92  Pulse: 80  Weight: 192 lb (87.1 kg)  Height: 5\' 7"  (1.702 m)    Physical Exam  Constitutional: She is well-developed, well-nourished, and in no distress. She is not irritable. No  distress.  HENT:  Head: Normocephalic and atraumatic.  Right Ear: External ear normal.  Left Ear: External ear normal.  Nose: Nose normal.  Mouth/Throat: Oropharynx is clear and moist.  Eyes: Conjunctivae and EOM are normal. Pupils are equal, round, and reactive to light. Right eye exhibits no discharge. Left eye exhibits no discharge.  Neck: Normal range of motion. Neck supple. No JVD present. No thyromegaly present.  Cardiovascular: Normal rate, regular rhythm, normal heart sounds and intact distal pulses.  Exam reveals no gallop and no friction rub.   No murmur heard. Pulmonary/Chest: Effort normal and breath sounds normal. She has no wheezes. She has no rales.  Abdominal: Soft. Bowel sounds are normal. She exhibits no mass. There is no tenderness. There is no guarding.  Musculoskeletal: Normal range of motion. She exhibits no edema.  Lymphadenopathy:    She has no cervical adenopathy.  Neurological: She is alert. She has normal reflexes.  Skin: Skin is warm and dry. She is not diaphoretic.  Psychiatric: Mood and affect normal.  Nursing note and vitals reviewed.     Assessment & Plan  Problem List Items Addressed This Visit    None    Visit Diagnoses    Anxiety and depression    -  Primary   Relevant Medications   sertraline (ZOLOFT) 50 MG tablet   Abrasion       Relevant Medications   mupirocin ointment (BACTROBAN) 2 %   Amenorrhea       Relevant Orders   POCT urine pregnancy (Completed)      Meds ordered this encounter  Medications  . sertraline (ZOLOFT) 50 MG tablet    Sig: Take 1 tablet (50 mg total) by mouth daily.    Dispense:  30 tablet    Refill:  3  . mupirocin ointment (BACTROBAN) 2 %    Sig: Apply 1 application topically 2 (two) times daily.    Dispense:  22 g    Refill:  0      Dr. Hayden [facility]  02/04/17

## 2017-03-07 ENCOUNTER — Encounter: Payer: Self-pay | Admitting: Family Medicine

## 2017-03-07 ENCOUNTER — Ambulatory Visit
Admission: RE | Admit: 2017-03-07 | Discharge: 2017-03-07 | Disposition: A | Discharge status: Home / Self Care | Payer: Managed Care, Other (non HMO) | Source: Ambulatory Visit | Attending: Family Medicine | Admitting: Family Medicine

## 2017-03-07 ENCOUNTER — Ambulatory Visit (INDEPENDENT_AMBULATORY_CARE_PROVIDER_SITE_OTHER): Payer: Managed Care, Other (non HMO) | Admitting: Family Medicine

## 2017-03-07 VITALS — BP 110/80 | HR 72 | Ht 67.0 in | Wt 204.0 lb

## 2017-03-07 DIAGNOSIS — S93492A Sprain of other ligament of left ankle, initial encounter: Secondary | ICD-10-CM | POA: Diagnosis not present

## 2017-03-07 DIAGNOSIS — S46911A Strain of unspecified muscle, fascia and tendon at shoulder and upper arm level, right arm, initial encounter: Secondary | ICD-10-CM

## 2017-03-07 DIAGNOSIS — W108XXA Fall (on) (from) other stairs and steps, initial encounter: Secondary | ICD-10-CM

## 2017-03-07 DIAGNOSIS — M25572 Pain in left ankle and joints of left foot: Secondary | ICD-10-CM | POA: Diagnosis present

## 2017-03-07 DIAGNOSIS — S93602A Unspecified sprain of left foot, initial encounter: Secondary | ICD-10-CM

## 2017-03-07 DIAGNOSIS — W109XXA Fall (on) (from) unspecified stairs and steps, initial encounter: Secondary | ICD-10-CM | POA: Insufficient documentation

## 2017-03-07 NOTE — Progress Notes (Signed)
Name: Maria Zavala   MRN: [id number]    DOB: [birth year]   Date:03/07/2017       Progress Note  Subjective  Chief Complaint  Chief Complaint  Patient presents with  . Foot Pain    fell last night down stairs- thinks that foot might have bent backwards while falling  . Shoulder Pain    held onto railing while falling, so arm bent back as she was sliding down the stairs- now has shoulder pain    Foot Pain  This is a new problem. The current episode started yesterday (10 pm). The problem occurs constantly. The problem has been waxing and waning. Associated symptoms include neck pain. Pertinent negatives include no abdominal pain, arthralgias, chest pain, chills, congestion, coughing, fever, headaches, joint swelling, myalgias, nausea, numbness, rash or sore throat. The symptoms are aggravated by standing and walking. She has tried rest and acetaminophen for the symptoms. The treatment provided mild relief.  Shoulder Pain   The pain is present in the right shoulder. This is a new problem. The current episode started yesterday. There has been a history of trauma (fall). The problem occurs daily. The problem has been gradually worsening. The pain is at a severity of 4/10. The pain is moderate. Pertinent negatives include no fever, numbness or tingling.  Fall  The accident occurred 6 to 12 hours ago. Fall occurred: stairs. The point of impact was the buttocks. The pain is present in the right shoulder and left foot. The pain is mild. The symptoms are aggravated by ambulation. Pertinent negatives include no abdominal pain, fever, headaches, hematuria, nausea, numbness or tingling.    No problem-specific Assessment & Plan notes found for this encounter.   Past Medical History:  Diagnosis Date  . Asthma   . Status post appendectomy 11/11/2016    Past Surgical History:  Procedure Laterality Date  . BREAST SURGERY     fibroid removed  . CHOLECYSTECTOMY    . TONSILLECTOMY      No family  history on file.  Social History   Social History  . Marital status: Single    Spouse name: N/A  . Number of children: N/A  . Years of education: N/A   Occupational History  . Not on file.   Social History Main Topics  . Smoking status: Never Smoker  . Smokeless tobacco: Never Used  . Alcohol use Yes  . Drug use: No  . Sexual activity: Yes   Other Topics Concern  . Not on file   Social History Narrative  . No narrative on file    Allergies  Allergen Reactions  . Adhesive [Tape] Rash    Outpatient Medications Prior to Visit  Medication Sig Dispense Refill  . albuterol (PROAIR HFA) 108 (90 Base) MCG/ACT inhaler INHALE 1-2 PUFFS INTO THE LUNGS EVERY 6 (SIX) HOURS AS NEEDED FOR WHEEZING OR SHORTNESS OF BREATH.    . mupirocin ointment (BACTROBAN) 2 % Apply 1 application topically 2 (two) times daily. 22 g 0  . sertraline (ZOLOFT) 50 MG tablet Take 1 tablet (50 mg total) by mouth daily. 30 tablet 3  . oxyCODONE (OXY IR/ROXICODONE) 5 MG immediate release tablet TAKE 1 TABLET (5 MG TOTAL) BY MOUTH EVERY SIX (6) HOURS AS NEEDED FOR PAIN.     No facility-administered medications prior to visit.     Review of Systems  Constitutional: Negative for chills, fever, malaise/fatigue and weight loss.  HENT: Negative for congestion, ear discharge, ear pain and sore throat.  Eyes: Negative for blurred vision.  Respiratory: Negative for cough, sputum production, shortness of breath and wheezing.   Cardiovascular: Negative for chest pain, palpitations and leg swelling.  Gastrointestinal: Negative for abdominal pain, blood in stool, constipation, diarrhea, heartburn, melena and nausea.  Genitourinary: Negative for dysuria, frequency, hematuria and urgency.  Musculoskeletal: Positive for neck pain. Negative for arthralgias, back pain, joint pain, joint swelling and myalgias.  Skin: Negative for rash.  Neurological: Negative for dizziness, tingling, sensory change, focal weakness,  numbness and headaches.  Endo/Heme/Allergies: Negative for environmental allergies and polydipsia. Does not bruise/bleed easily.  Psychiatric/Behavioral: Negative for depression and suicidal ideas. The patient is not nervous/anxious and does not have insomnia.      Objective  Vitals:   03/07/17 1006  BP: 110/80  Pulse: 72  Weight: 204 lb (92.5 kg)  Height: 5\' 7"  (1.702 m)    Physical Exam  Constitutional: She is well-developed, well-nourished, and in no distress. No distress.  HENT:  Head: Normocephalic and atraumatic.  Right Ear: External ear normal.  Left Ear: External ear normal.  Nose: Nose normal.  Mouth/Throat: Oropharynx is clear and moist.  Eyes: Pupils are equal, round, and reactive to light. Conjunctivae and EOM are normal. Right eye exhibits no discharge. Left eye exhibits no discharge.  Neck: Normal range of motion. Neck supple. No JVD present. No thyromegaly present.  Cardiovascular: Normal rate, regular rhythm, normal heart sounds and intact distal pulses.  Exam reveals no gallop and no friction rub.   No murmur heard. Pulmonary/Chest: Effort normal and breath sounds normal. She has no wheezes. She has no rales.  Abdominal: Soft. Bowel sounds are normal. She exhibits no mass. There is no tenderness. There is no guarding.  Musculoskeletal: Normal range of motion. She exhibits no edema.       Right shoulder: She exhibits tenderness. She exhibits normal range of motion, no bony tenderness, no swelling, no deformity and no spasm.       Left ankle: Tenderness. Lateral malleolus and AITFL tenderness found.       Left foot: There is tenderness. There is normal range of motion, no bony tenderness and no swelling.  Lymphadenopathy:    She has no cervical adenopathy.  Neurological: She is alert. She has normal sensation, normal strength and normal reflexes.  Skin: Skin is warm and dry. She is not diaphoretic.  Psychiatric: Mood and affect normal.  Nursing note and vitals  reviewed.     Assessment & Plan  Problem List Items Addressed This Visit    None    Visit Diagnoses    Strain of right shoulder, initial encounter    -  Primary   Sprain of anterior talofibular ligament of left ankle, initial encounter       Sprain of left foot, initial encounter       Fall (on) (from) other stairs and steps, initial encounter       Relevant Orders   DG Foot Complete Left   DG Ankle Complete Left      No orders of the defined types were placed in this encounter.     Dr. Hayden R[facility]usseneanna [facility]  03/07/17

## 2017-03-25 ENCOUNTER — Other Ambulatory Visit: Payer: Self-pay | Admitting: Family Medicine

## 2017-03-25 DIAGNOSIS — F329 Major depressive disorder, single episode, unspecified: Secondary | ICD-10-CM

## 2017-03-25 DIAGNOSIS — F419 Anxiety disorder, unspecified: Principal | ICD-10-CM

## 2017-03-27 ENCOUNTER — Ambulatory Visit
Admission: EM | Admit: 2017-03-27 | Discharge: 2017-03-27 | Disposition: A | Discharge status: Home / Self Care | Payer: Managed Care, Other (non HMO) | Attending: Emergency Medicine | Admitting: Emergency Medicine

## 2017-03-27 ENCOUNTER — Encounter: Payer: Self-pay | Admitting: Emergency Medicine

## 2017-03-27 DIAGNOSIS — J0140 Acute pansinusitis, unspecified: Secondary | ICD-10-CM | POA: Diagnosis not present

## 2017-03-27 DIAGNOSIS — R1010 Upper abdominal pain, unspecified: Secondary | ICD-10-CM

## 2017-03-27 LAB — URINALYSIS, COMPLETE (UACMP) WITH MICROSCOPIC
Bilirubin Urine: NEGATIVE
Glucose, UA: NEGATIVE mg/dL
Ketones, ur: NEGATIVE mg/dL
Leukocytes, UA: NEGATIVE
Nitrite: NEGATIVE
PH: 5.5 (ref 5.0–8.0)
Protein, ur: NEGATIVE mg/dL
SPECIFIC GRAVITY, URINE: 1.02 (ref 1.005–1.030)

## 2017-03-27 LAB — PREGNANCY, URINE: Preg Test, Ur: NEGATIVE

## 2017-03-27 MED ORDER — AMOXICILLIN-POT CLAVULANATE 875-125 MG PO TABS: 1.0000 | ORAL_TABLET | Freq: Two times a day (BID) | ORAL | 0 refills | Status: DC

## 2017-03-27 MED ORDER — IBUPROFEN 600 MG PO TABS: 600.0000 mg | ORAL_TABLET | Freq: Four times a day (QID) | ORAL | 0 refills | Status: DC | PRN

## 2017-03-27 MED ORDER — FAMOTIDINE 20 MG PO TABS: 20.0000 mg | ORAL_TABLET | Freq: Two times a day (BID) | ORAL | 0 refills | Status: DC

## 2017-03-27 MED ORDER — FLUTICASONE PROPIONATE 50 MCG/ACT NA SUSP: 2.0000 | Freq: Every day | NASAL | 0 refills | Status: DC

## 2017-03-27 NOTE — ED Triage Notes (Signed)
Patient c/o nasal congestion, sinus congestion and pressure since Sunday.  Patient reports stomach and abdominal pain that started 2 days ago.

## 2017-03-27 NOTE — Discharge Instructions (Signed)
You may take 600 mg of motrin with 1 gram of tylenol up to 3-4 times a day as needed for pain. This is an effective combination for pain.  Most sinus infections are viral and do not need antibiotics unless you have a high fever, have had this for 10 days, or you get better and then get sick again. Use a neti pot or the NeilMed sinus rinse as often as you want to to reduce nasal congestion. Follow the directions on the box.   Try the Pepcid and a bland diet for the next several days and see if that makes her stomach feel any better.  Go to www.goodrx.com to look up your medications. This will give you a list of where you can find your prescriptions at the most affordable prices. Or you can ask the pharmacist what the cash price is. This is frequently cheaper than going through insurance.

## 2017-03-27 NOTE — ED Provider Notes (Signed)
HPI  SUBJECTIVE:  Maria Zavala is a 26 y.o. female who presents with 2 complaints. First and foremost she complains of 4 days of green nasal congestion, rhinorrhea, mild frontal headache and sinus pain and pressure with mild photophobia, postnasal drip. She states that she feels like her face is swollen underneath her eyes. She reports sneezing, itchy, watery eyes and a cough with deep inspiration and talking. She had a scratchy, mild sore throat but this is since resolved. Denies fevers, upper dental pain, ear pain. She tried ibuprofen 800 mg loratadine, decongestant without improvement in her symptoms. Symptoms are better with Barbaraann Faster, Vicks VapoRub. Symptoms are worse with being outside. She is not been using any nasal steroids or saline nasal irrigation. She denies double sickening.  Second, she reports gassy upper abdominal pain, nausea, malaise, "bloating". States it is constant, started yesterday. States that she is belching and has burning, substernal chest pain. She is tolerating by mouth today but reports nausea with eating. She denies diarrhea, abdominal distention, urinary complaints.. Symptoms are better with warm showers and chewing mint gum, worse with eating. She has had similar symptoms before suspects that she may have GERD. States the symptoms are most likely from the postnasal drainage. She also reports lower abdomen cramping that has been present for months. She has been amenorrheic since April but has had negative pregnancy tests in the past. She wants to make sure that she does not have a UTI today. She states that this pain is not new today. She has a past medical history of seasonal allergies in the spring, sinusitis, status post cholecystectomy and appendectomy. No history of GERD, peptic ulcer disease. HAL:[id number], Vanita Panda, MD   Past Medical History:  Diagnosis Date  . Asthma   . Status post appendectomy 11/11/2016    Past Surgical History:  Procedure Laterality Date  .  APPENDECTOMY    . BREAST SURGERY     fibroid removed  . CHOLECYSTECTOMY    . TONSILLECTOMY      History reviewed. No pertinent family history.  Social History  Substance Use Topics  . Smoking status: Never Smoker  . Smokeless tobacco: Never Used  . Alcohol use Yes    No current facility-administered medications for this encounter.   Current Outpatient Prescriptions:  .  albuterol (PROAIR HFA) 108 (90 Base) MCG/ACT inhaler, INHALE 1-2 PUFFS INTO THE LUNGS EVERY 6 (SIX) HOURS AS NEEDED FOR WHEEZING OR SHORTNESS OF BREATH., Disp: , Rfl:  .  amoxicillin-clavulanate (AUGMENTIN) 875-125 MG tablet, Take 1 tablet by mouth 2 (two) times daily. X 7 days, Disp: 14 tablet, Rfl: 0 .  famotidine (PEPCID) 20 MG tablet, Take 1 tablet (20 mg total) by mouth 2 (two) times daily., Disp: 40 tablet, Rfl: 0 .  fluticasone (FLONASE) 50 MCG/ACT nasal spray, Place 2 sprays into both nostrils daily., Disp: 16 g, Rfl: 0 .  ibuprofen (ADVIL,MOTRIN) 600 MG tablet, Take 1 tablet (600 mg total) by mouth every 6 (six) hours as needed., Disp: 30 tablet, Rfl: 0 .  sertraline (ZOLOFT) 50 MG tablet, TAKE 1 TABLET BY MOUTH EVERY DAY, Disp: 30 tablet, Rfl: 0  Allergies  Allergen Reactions  . Adhesive [Tape] Rash     ROS  As noted in HPI.   Physical Exam  BP 98/68 (BP Location: Left Arm)   Pulse 82   Temp 97.7 F (36.5 C) (Oral)   Resp 16   Ht 5\' 7"  (1.702 m)   Wt 190 lb (86.2 kg)   LMP  11/13/2016 (Approximate)   SpO2 100%   BMI 29.76 kg/m   Constitutional: Well developed, well nourished, no acute distress Eyes:  EOMI, conjunctiva normal bilaterally HENT: Normocephalic, atraumatic,mucus membranes moist. TMs partially obscured with cerumen but appear normal. No erythema, dullness, bulging. Positive erythematous, swollen turbinates and clear rhinorrhea. Positive maxillary and frontal sinus tenderness. Normal oropharynx with cobblestoning. No obvious postnasal drip Respiratory: Normal inspiratory effort  lungs clear bilaterally Cardiovascular: Normal rate regular rhythm no murmurs rubs gallops GI: nondistended soft, active bowel sounds. No guarding, rebound. Mild left upper quadrant tenderness. Negative Murphy. Negative McBurney. Positive mild suprapubic tenderness. No flank tenderness. skin: No rash, skin intact Musculoskeletal: no deformities Neurologic: Alert & oriented x 3, no focal neuro deficits Psychiatric: Speech and behavior appropriate   ED Course   Medications - No data to display  Orders Placed This Encounter  Procedures  . Urine culture    Standing Status:   Standing    Number of Occurrences:   1  . Urinalysis, Complete w Microscopic    Standing Status:   Standing    Number of Occurrences:   1  . Pregnancy, urine    Standing Status:   Standing    Number of Occurrences:   1    Results for orders placed or performed during the hospital encounter of 03/27/17 (from the past 24 hour(s))  Urinalysis, Complete w Microscopic     Status: Abnormal   Collection Time: 03/27/17  4:06 PM  Result Value Ref Range   Color, Urine YELLOW YELLOW   APPearance CLEAR CLEAR   Specific Gravity, Urine 1.020 1.005 - 1.030   pH 5.5 5.0 - 8.0   Glucose, UA NEGATIVE NEGATIVE mg/dL   Hgb urine dipstick TRACE (A) NEGATIVE   Bilirubin Urine NEGATIVE NEGATIVE   Ketones, ur NEGATIVE NEGATIVE mg/dL   Protein, ur NEGATIVE NEGATIVE mg/dL   Nitrite NEGATIVE NEGATIVE   Leukocytes, UA NEGATIVE NEGATIVE   Squamous Epithelial / LPF 0-5 (A) NONE SEEN   WBC, UA 0-5 0 - 5 WBC/hpf   RBC / HPF 21-50 0 - 5 RBC/hpf   Bacteria, UA FEW (A) NONE SEEN   Mucus PRESENT   Pregnancy, urine     Status: None   Collection Time: 03/27/17  4:06 PM  Result Value Ref Range   Preg Test, Ur NEGATIVE NEGATIVE   No results found.  ED Clinical Impression  Acute pansinusitis, recurrence not specified  Pain of upper abdomen   ED Assessment/Plan   Presentation consistent with   1. Sinusitis, most likely viral.  Discussed this extensively with patient. Home with Mucinex D, Flonase, saline nasal irrigation, ibuprofen 600 mg with 1 g of Tylenol and a wait-and-see prescription of Augmentin.  2. Gastritis versus GERD. Home with some Pepcid. No evidence of a surgical abdomen.  Hematuria noted, but this was of a contaminated specimen. We'll send urine off for culture to confirm absence of UTI given that she had suprapubic tenderness but she states this is not new and that she has had this for some time. States that she will discuss this with her OB/GYN. We'll withhold antibiotic treatment pending urine culture results.  Follow-up with PMD and OB/GYN as needed. To the ER if she gets worse.  Discussed labs, MDM, plan and followup with patient  Discussed sn/sx that should prompt return to the ED. Patient agrees with plan.   Meds ordered this encounter  Medications  . fluticasone (FLONASE) 50 MCG/ACT nasal spray    Sig: Place  2 sprays into both nostrils daily.    Dispense:  16 g    Refill:  0  . ibuprofen (ADVIL,MOTRIN) 600 MG tablet    Sig: Take 1 tablet (600 mg total) by mouth every 6 (six) hours as needed.    Dispense:  30 tablet    Refill:  0  . amoxicillin-clavulanate (AUGMENTIN) 875-125 MG tablet    Sig: Take 1 tablet by mouth 2 (two) times daily. X 7 days    Dispense:  14 tablet    Refill:  0  . famotidine (PEPCID) 20 MG tablet    Sig: Take 1 tablet (20 mg total) by mouth 2 (two) times daily.    Dispense:  40 tablet    Refill:  0    *This clinic note was created using Scientist, clinical (histocompatibility and immunogenetics). Therefore, there may be occasional mistakes despite careful proofreading.  ?   Domenick Gong, MD 03/27/17 2100

## 2017-03-29 LAB — URINE CULTURE

## 2017-04-23 ENCOUNTER — Other Ambulatory Visit: Payer: Self-pay | Admitting: Family Medicine

## 2017-04-23 DIAGNOSIS — F329 Major depressive disorder, single episode, unspecified: Secondary | ICD-10-CM

## 2017-04-23 DIAGNOSIS — F419 Anxiety disorder, unspecified: Principal | ICD-10-CM

## 2017-04-25 ENCOUNTER — Other Ambulatory Visit: Payer: Self-pay

## 2017-05-06 ENCOUNTER — Ambulatory Visit: Payer: Managed Care, Other (non HMO) | Admitting: Family Medicine

## 2017-08-08 ENCOUNTER — Ambulatory Visit: Payer: Managed Care, Other (non HMO) | Admitting: Family Medicine

## 2018-05-05 ENCOUNTER — Other Ambulatory Visit: Payer: Self-pay | Admitting: Family Medicine

## 2018-05-05 DIAGNOSIS — F419 Anxiety disorder, unspecified: Principal | ICD-10-CM

## 2018-05-05 DIAGNOSIS — F329 Major depressive disorder, single episode, unspecified: Secondary | ICD-10-CM

## 2019-04-25 IMAGING — CR DG ANKLE COMPLETE 3+V*L*
3 series · 3 of 3 positions shown · non-contrast
Comparison: None.

CLINICAL DATA: Fall down steps.  Lateral ankle and foot pain.

EXAM:
LEFT ANKLE COMPLETE - 3+ VIEW

[ankle ap]
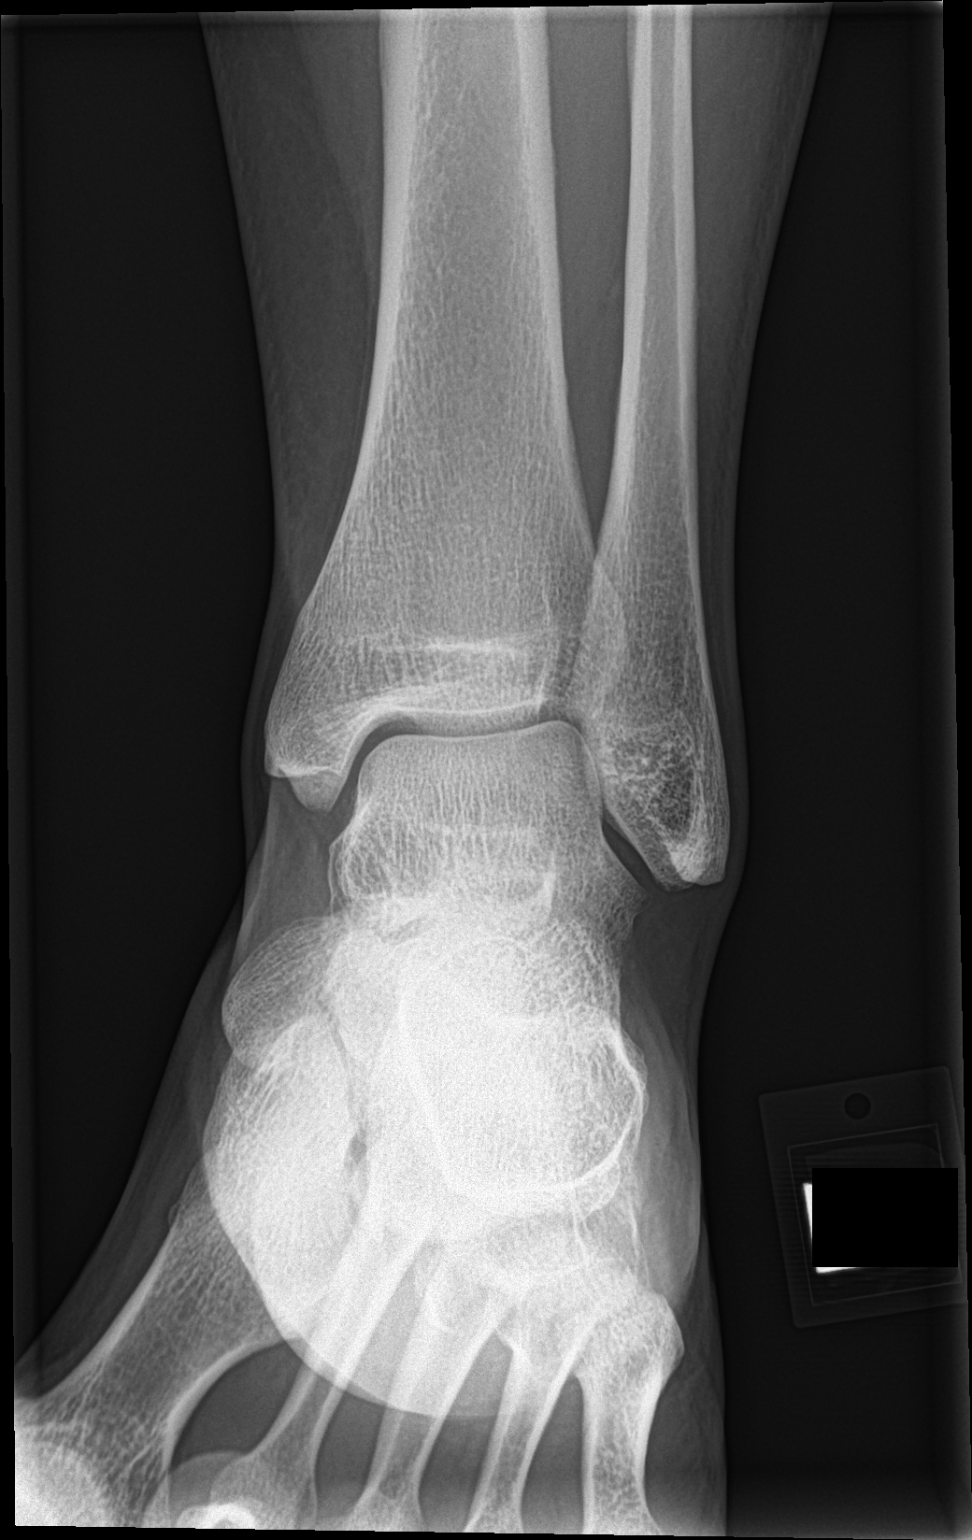

[ankle obl]
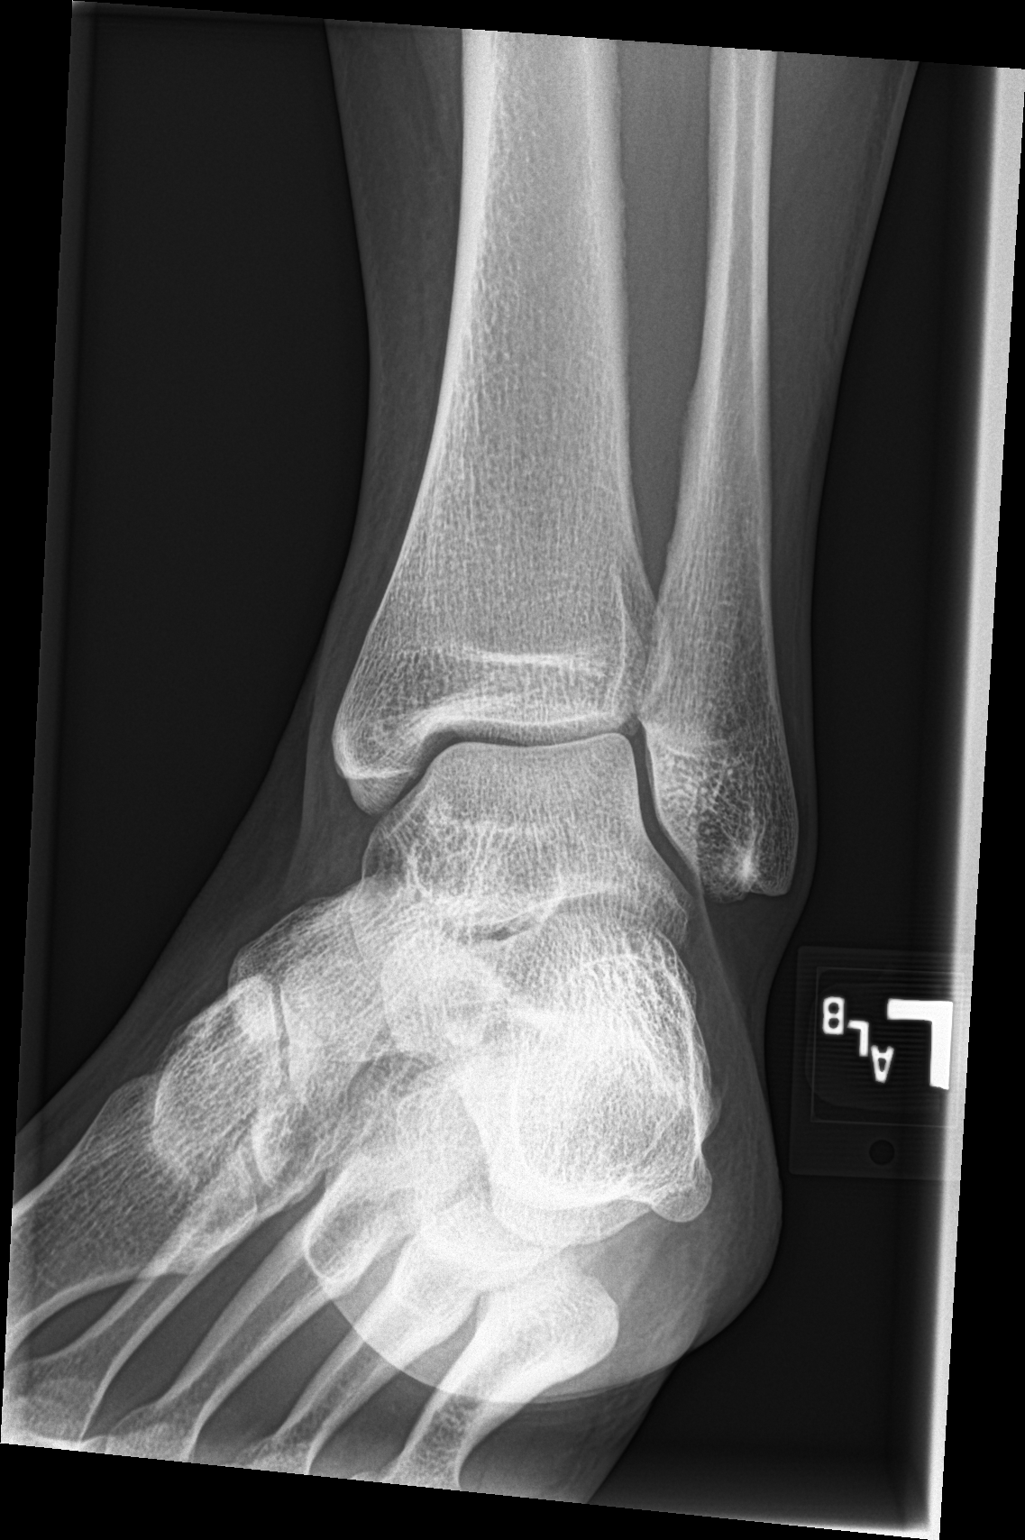

[ankle lat]
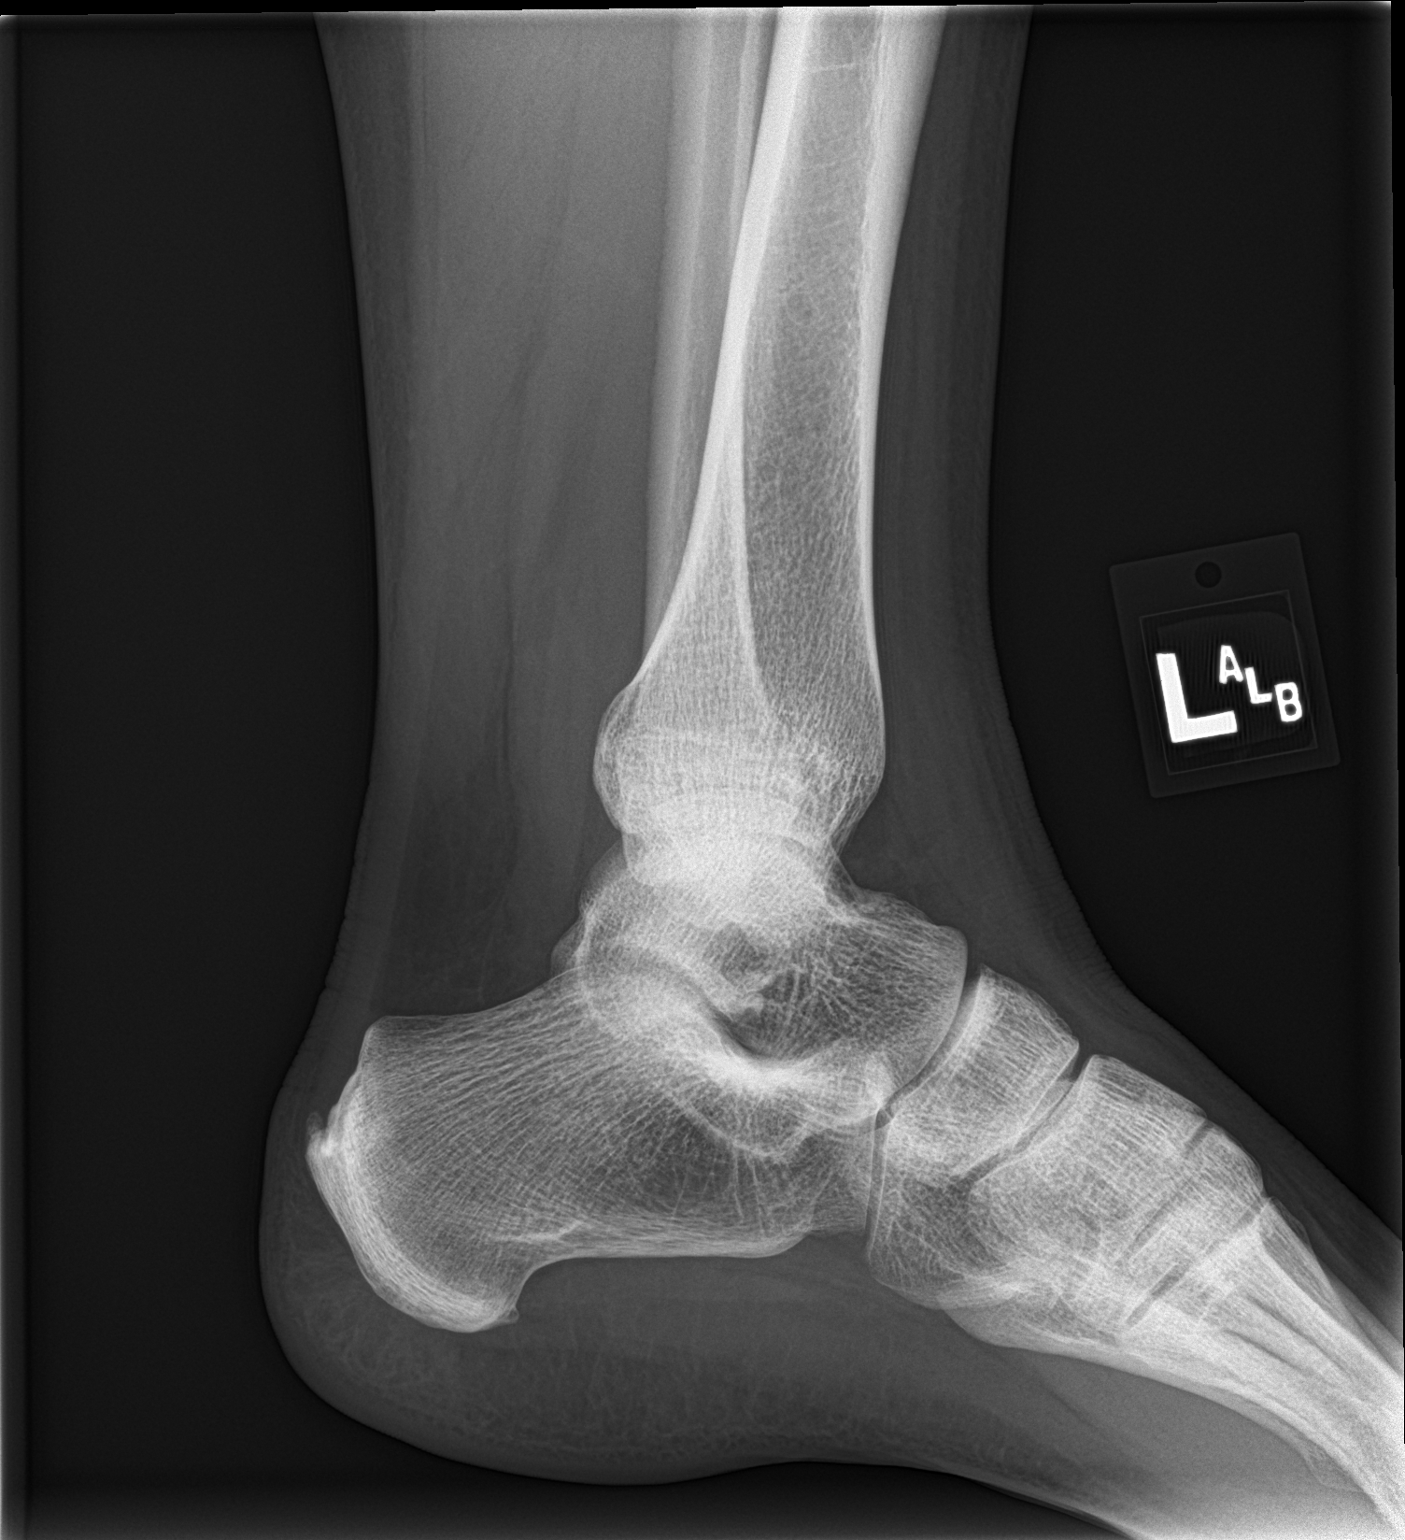

[3 of 3 positions shown; findings below may reference images not displayed]

FINDINGS: There is no evidence of fracture, dislocation, or joint effusion.
There is no evidence of arthropathy or other focal bone abnormality.
Soft tissues are unremarkable.
IMPRESSION: Negative.

## 2021-05-30 ENCOUNTER — Other Ambulatory Visit: Payer: Self-pay

## 2021-05-30 ENCOUNTER — Ambulatory Visit
Admission: EM | Admit: 2021-05-30 | Discharge: 2021-05-30 | Disposition: A | Discharge status: Home / Self Care | Payer: 59

## 2021-05-30 DIAGNOSIS — J069 Acute upper respiratory infection, unspecified: Secondary | ICD-10-CM

## 2021-05-30 MED ORDER — HYDROCODONE BIT-HOMATROP MBR 5-1.5 MG/5ML PO SOLN: 5.0000 mL | Freq: Every evening | ORAL | 0 refills | Status: DC | PRN

## 2021-05-30 MED ORDER — METHYLPREDNISOLONE 4 MG PO TBPK: ORAL_TABLET | ORAL | 0 refills | Status: DC

## 2021-05-30 MED ORDER — BENZONATATE 100 MG PO CAPS: 100.0000 mg | ORAL_CAPSULE | Freq: Three times a day (TID) | ORAL | 0 refills | Status: DC

## 2021-05-30 MED ORDER — AZITHROMYCIN 250 MG PO TABS: ORAL_TABLET | ORAL | 0 refills | Status: DC

## 2021-05-30 NOTE — ED Triage Notes (Signed)
Pt here with C/O chest tightness, pt does have asthma, cough. Dad was here yesterday and DX with sinus infection.

## 2021-05-30 NOTE — Discharge Instructions (Addendum)
-  Take medications as prescribed. -Use Tessalon during the day for cough suppression, can use cough syrup at night as needed for cough symptoms. -Use rescue inhaler as needed for symptoms. -Follow-up if symptoms worsening.

## 2021-06-01 NOTE — ED Provider Notes (Signed)
[facility]    CSN: [id number] Arrival date & time: 05/30/21  1835      History   Chief Complaint Chief Complaint  Patient presents with   Cough    HPI Maria Zavala is a 30 y.o. female who presents today for evaluation of cough, chest tightness and chest pressure ongoing for the past several days.  The patient does have a history of asthma, she has a rescue inhaler and also uses a steroid inhaler at home.  The patient states that her father was evaluated several days ago and diagnosed with a sinus infection.  The patient has noticed increased wheeze and increased cough over the past 24-48 hours.  She reports a sharp pain in her chest but denies any numbness or tingling her arms, she denies any dizziness or headache, denies any increased heart rate.  The patient reports a cough with mild sputum production.  The patient reports that her eyes have been watering as well.  She denies any fevers or chills at home.  Denies any abdominal pain, nausea, vomiting.  The patient does report some runny nose and feelings of postnasal drainage.   Cough Associated symptoms: rhinorrhea and wheezing   Associated symptoms: no chest pain and no fever    Past Medical History:  Diagnosis Date   Asthma    Status post appendectomy 11/11/2016    There are no problems to display for this patient.   Past Surgical History:  Procedure Laterality Date   APPENDECTOMY     BREAST SURGERY     fibroid removed   CHOLECYSTECTOMY     TONSILLECTOMY      OB History   No obstetric history on file.      Home Medications    Prior to Admission medications   Medication Sig Start Date End Date Taking? Authorizing Provider  albuterol (VENTOLIN HFA) 108 (90 Base) MCG/ACT inhaler INHALE 1-2 PUFFS INTO THE LUNGS EVERY 6 (SIX) HOURS AS NEEDED FOR WHEEZING OR SHORTNESS OF BREATH. 10/24/16  Yes [provider]  azithromycin (ZITHROMAX Z-PAK) 250 MG tablet Take per package instructions 05/30/21   Yes Anson Oregon, PA-C  beclomethasone (QVAR) 80 MCG/ACT inhaler Inhale into the lungs. 07/19/20 07/19/21 Yes [provider]  benzonatate (TESSALON) 100 MG capsule Take 1 capsule (100 mg total) by mouth every 8 (eight) hours. 05/30/21  Yes Anson Oregon, PA-C  cetirizine (ZYRTEC) 10 MG tablet Take by mouth.   Yes [provider]  fluticasone (FLONASE) 50 MCG/ACT nasal spray Place 2 sprays into both nostrils daily. 03/27/17  Yes Domenick Gong, MD  HYDROcodone bit-homatropine (HYCODAN) 5-1.5 MG/5ML syrup Take 5 mLs by mouth at bedtime as needed for cough. 05/30/21  Yes Anson Oregon, PA-C  lamoTRIgine (LAMICTAL) 200 MG tablet Take by mouth. 05/02/21 06/14/21 Yes [provider]  levonorgestrel (PLAN B 1-STEP) 1.5 MG tablet TAKE 1 TABLET (1.5 MG TOTAL) BY MOUTH ONCE 04/14/18  Yes [provider]  methylPREDNISolone (MEDROL DOSEPAK) 4 MG TBPK tablet Take per package instructions 05/30/21  Yes Anson Oregon, PA-C  pantoprazole (PROTONIX) 40 MG tablet Take 40 mg by mouth daily. 12/21/20  Yes [provider]    Family History History reviewed. No pertinent family history.  Social History Social History   Tobacco Use   Smoking status: Never   Smokeless tobacco: Never  Vaping Use   Vaping Use: Never used  Substance Use Topics   Alcohol use: Yes   Drug use: No  Allergies   Adhesive [tape]   Review of Systems Review of Systems  Constitutional: Negative.  Negative for fever.  HENT:  Positive for rhinorrhea.   Eyes: Negative.   Respiratory:  Positive for cough and wheezing.   Cardiovascular:  Negative for chest pain.  Gastrointestinal:  Negative for nausea and vomiting.  Endocrine: Negative.   Genitourinary: Negative.   Musculoskeletal: Negative.   Skin: Negative.   Allergic/Immunologic: Negative.   Neurological: Negative.   Hematological: Negative.   Psychiatric/Behavioral: Negative.     Physical  Exam Triage Vital Signs ED Triage Vitals  Enc Vitals Group     BP 05/30/21 1922 118/78     Pulse Rate 05/30/21 1922 89     Resp 05/30/21 1922 18     Temp 05/30/21 1922 98.8 F (37.1 C)     Temp Source 05/30/21 1922 Oral     SpO2 05/30/21 1922 96 %     Weight 05/30/21 1919 210 lb (95.3 kg)     Height 05/30/21 1919 5\' 7"  (1.702 m)     Head Circumference --      Peak Flow --      Pain Score 05/30/21 1919 8     Pain Loc --      Pain Edu? --      Excl. in GC? --    No data found.  Updated Vital Signs BP 118/78 (BP Location: Left Arm)   Pulse 89   Temp 98.8 F (37.1 C) (Oral)   Resp 18   Ht 5\' 7"  (1.702 m)   Wt 210 lb (95.3 kg)   LMP  (LMP Unknown)   SpO2 96%   BMI 32.89 kg/m   Visual Acuity Right Eye Distance:   Left Eye Distance:   Bilateral Distance:    Right Eye Near:   Left Eye Near:    Bilateral Near:     Physical Exam Vitals reviewed.  Constitutional:      Appearance: She is not ill-appearing or toxic-appearing.  HENT:     Head: Normocephalic.     Right Ear: Tympanic membrane normal.     Left Ear: Tympanic membrane normal.     Nose: Rhinorrhea present.     Mouth/Throat:     Mouth: Mucous membranes are moist.     Pharynx: No oropharyngeal exudate or posterior oropharyngeal erythema.  Eyes:     Extraocular Movements: Extraocular movements intact.     Pupils: Pupils are equal, round, and reactive to light.  Cardiovascular:     Rate and Rhythm: Normal rate and regular rhythm.     Heart sounds: No murmur heard.   No friction rub. No gallop.  Pulmonary:     Effort: Pulmonary effort is normal.     Breath sounds: Examination of the right-upper field reveals wheezing. Examination of the left-upper field reveals wheezing. Wheezing present.  Abdominal:     Comments: Abdomen is soft, nontender with normal bowel sounds. Negative CVA tenderness.  Musculoskeletal:     Cervical back: Normal range of motion.  Lymphadenopathy:     Cervical: No cervical  adenopathy.  Neurological:     Mental Status: She is alert.     UC Treatments / Results  Labs (all labs ordered are listed, but only abnormal results are displayed) Labs Reviewed - No data to display  EKG   Radiology No results found.  Procedures Procedures (including critical care time)  Medications Ordered in UC Medications - No data to display  Initial Impression / Assessment  and Plan / UC Course  I have reviewed the triage vital signs and the nursing notes.  Pertinent labs & imaging results that were available during my care of the patient were reviewed by me and considered in my medical decision making (see chart for details).     Treatment options were discussed today with the patient. I believe that the patient is experiencing either an asthma exacerbation versus possible upper respiratory tract infection. The patient was prescribed azithromycin in addition to a Medrol Dosepak. The patient was given Tessalon in addition to [facility] for cough symptoms as well. Follow-up if no improvement of symptoms. Final Clinical Impressions(s) / UC Diagnoses   Final diagnoses:  Upper respiratory tract infection, unspecified type     Discharge Instructions      -Take medications as prescribed. -Use Tessalon during the day for cough suppression, can use cough syrup at night as needed for cough symptoms. -Use rescue inhaler as needed for symptoms. -Follow-up if symptoms worsening.   ED Prescriptions     Medication Sig Dispense Auth. Provider   azithromycin (ZITHROMAX Z-PAK) 250 MG tablet Take per package instructions 6 tablet Anson Oregon, PA-C   methylPREDNISolone (MEDROL DOSEPAK) 4 MG TBPK tablet Take per package instructions 21 tablet Anson Oregon, PA-C   benzonatate (TESSALON) 100 MG capsule Take 1 capsule (100 mg total) by mouth every 8 (eight) hours. 21 capsule Anson Oregon, PA-C   HYDROcodone bit-homatropine (HYCODAN) 5-1.5 MG/5ML syrup Take 5  mLs by mouth at bedtime as needed for cough. 120 mL Anson Oregon, PA-C      PDMP not reviewed this encounter.   Anson Oregon, PA-C 06/01/21 1427

## 2022-04-01 ENCOUNTER — Ambulatory Visit
Admission: EM | Admit: 2022-04-01 | Discharge: 2022-04-01 | Disposition: A | Discharge status: Home / Self Care | Payer: 59 | Attending: Emergency Medicine | Admitting: Emergency Medicine

## 2022-04-01 ENCOUNTER — Encounter: Payer: Self-pay | Admitting: Emergency Medicine

## 2022-04-01 DIAGNOSIS — J069 Acute upper respiratory infection, unspecified: Secondary | ICD-10-CM | POA: Diagnosis present

## 2022-04-01 LAB — GROUP A STREP BY PCR: Group A Strep by PCR: NOT DETECTED

## 2022-04-01 MED ORDER — PROMETHAZINE-DM 6.25-15 MG/5ML PO SYRP: 5.0000 mL | ORAL_SOLUTION | Freq: Four times a day (QID) | ORAL | 0 refills | Status: DC | PRN

## 2022-04-01 MED ORDER — IPRATROPIUM BROMIDE 0.06 % NA SOLN: 2.0000 | Freq: Four times a day (QID) | NASAL | 12 refills | Status: DC

## 2022-04-01 MED ORDER — BENZONATATE 100 MG PO CAPS: 200.0000 mg | ORAL_CAPSULE | Freq: Three times a day (TID) | ORAL | 0 refills | Status: DC

## 2022-04-01 NOTE — ED Provider Notes (Signed)
[facility]    CSN: [id number] Arrival date & time: 04/01/22  1502      History   Chief Complaint Chief Complaint  Patient presents with   Otalgia   Sinus Problem    HPI Maria Zavala is a 31 y.o. female.   HPI  31 year old female here for evaluation of respiratory complaints.  Patient reports that for last 3 days she has been experiencing a headache, sinus pressure, bilateral ear pressure.  She denies any measured fever.  Her nasal discharge has been clear.  She does endorse a mild sore throat, nonproductive cough, and mild shortness of breath and wheezing.  Patient does have a history of asthma but states that she has not had to use her inhaler.  She thinks her shortness of breath is due to the fact she cannot breathe through her nose.  Patient's children have been sick with strep.  She is declining COVID testing today.  Past Medical History:  Diagnosis Date   Asthma    Status post appendectomy 11/11/2016    There are no problems to display for this patient.   Past Surgical History:  Procedure Laterality Date   APPENDECTOMY     BREAST SURGERY     fibroid removed   CHOLECYSTECTOMY     TONSILLECTOMY      OB History   No obstetric history on file.      Home Medications    Prior to Admission medications   Medication Sig Start Date End Date Taking? Authorizing Provider  benzonatate (TESSALON) 100 MG capsule Take 2 capsules (200 mg total) by mouth every 8 (eight) hours. 04/01/22  Yes Margarette Canada, NP  hydrOXYzine (ATARAX) 25 MG tablet Take 1 to 2 tablets every 4 to 6 hours as needed for itching. 06/03/18  Yes [provider]  ipratropium (ATROVENT) 0.06 % nasal spray Place 2 sprays into both nostrils 4 (four) times daily. 04/01/22  Yes Margarette Canada, NP  levonorgestrel (PLAN B 1-STEP) 1.5 MG tablet TAKE 1 TABLET (1.5 MG TOTAL) BY MOUTH ONCE 04/14/18  Yes [provider]  pantoprazole (PROTONIX) 40 MG tablet Take 40 mg by mouth daily.  12/21/20  Yes [provider]  promethazine-dextromethorphan (PROMETHAZINE-DM) 6.25-15 MG/5ML syrup Take 5 mLs by mouth 4 (four) times daily as needed. 04/01/22  Yes Margarette Canada, NP  albuterol (VENTOLIN HFA) 108 (90 Base) MCG/ACT inhaler INHALE 1-2 PUFFS INTO THE LUNGS EVERY 6 (SIX) HOURS AS NEEDED FOR WHEEZING OR SHORTNESS OF BREATH. 10/24/16   [provider]  cetirizine (ZYRTEC) 10 MG tablet Take by mouth.    [provider]  fluticasone (FLONASE) 50 MCG/ACT nasal spray Place 2 sprays into both nostrils daily. 03/27/17   Melynda Ripple, MD  lamoTRIgine (LAMICTAL) 200 MG tablet Take by mouth. 05/02/21 06/14/21  [provider]    Family History History reviewed. No pertinent family history.  Social History Social History   Tobacco Use   Smoking status: Never   Smokeless tobacco: Never  Vaping Use   Vaping Use: Never used  Substance Use Topics   Alcohol use: Yes   Drug use: No     Allergies   Adhesive [tape]   Review of Systems Review of Systems  Constitutional:  Negative for fever.  HENT:  Positive for congestion, ear pain, rhinorrhea and sore throat.   Respiratory:  Positive for cough, shortness of breath and wheezing.      Physical Exam Triage Vital Signs ED Triage Vitals  Enc Vitals Group  BP 04/01/22 1516 112/76     Pulse Rate 04/01/22 1516 97     Resp 04/01/22 1516 14     Temp 04/01/22 1516 98.5 F (36.9 C)     Temp Source 04/01/22 1516 Oral     SpO2 04/01/22 1516 96 %     Weight 04/01/22 1514 200 lb (90.7 kg)     Height 04/01/22 1514 5\' 7"  (1.702 m)     Head Circumference --      Peak Flow --      Pain Score 04/01/22 1514 6     Pain Loc --      Pain Edu? --      Excl. in GC? --    No data found.  Updated Vital Signs BP 112/76 (BP Location: Left Arm)   Pulse 97   Temp 98.5 F (36.9 C) (Oral)   Resp 14   Ht 5\' 7"  (1.702 m)   Wt 200 lb (90.7 kg)   LMP 03/28/2022 (Exact Date)   SpO2 96%   BMI 31.32 kg/m    Visual Acuity Right Eye Distance:   Left Eye Distance:   Bilateral Distance:    Right Eye Near:   Left Eye Near:    Bilateral Near:     Physical Exam Vitals and nursing note reviewed.  Constitutional:      Appearance: Normal appearance. She is not ill-appearing.  HENT:     Head: Normocephalic and atraumatic.     Right Ear: Tympanic membrane, ear canal and external ear normal. There is no impacted cerumen.     Left Ear: Tympanic membrane, ear canal and external ear normal. There is no impacted cerumen.     Nose: Congestion and rhinorrhea present.     Mouth/Throat:     Mouth: Mucous membranes are moist.     Pharynx: Oropharynx is clear. Posterior oropharyngeal erythema present. No oropharyngeal exudate.  Cardiovascular:     Rate and Rhythm: Normal rate and regular rhythm.     Pulses: Normal pulses.     Heart sounds: Normal heart sounds. No murmur heard.    No friction rub. No gallop.  Pulmonary:     Effort: Pulmonary effort is normal.     Breath sounds: Normal breath sounds. No wheezing, rhonchi or rales.  Musculoskeletal:     Cervical back: Normal range of motion and neck supple.  Lymphadenopathy:     Cervical: No cervical adenopathy.  Skin:    General: Skin is warm and dry.     Capillary Refill: Capillary refill takes less than 2 seconds.     Findings: No erythema or rash.  Neurological:     General: No focal deficit present.     Mental Status: She is alert and oriented to person, place, and time.  Psychiatric:        Mood and Affect: Mood normal.        Behavior: Behavior normal.        Thought Content: Thought content normal.        Judgment: Judgment normal.      UC Treatments / Results  Labs (all labs ordered are listed, but only abnormal results are displayed) Labs Reviewed  GROUP A STREP BY PCR    EKG   Radiology No results found.  Procedures Procedures (including critical care time)  Medications Ordered in UC Medications - No data to  display  Initial Impression / Assessment and Plan / UC Course  I have reviewed the triage vital signs  and the nursing notes.  Pertinent labs & imaging results that were available during my care of the patient were reviewed by me and considered in my medical decision making (see chart for details).   Patient is a pleasant, nontoxic-appearing 31 year old female here for evaluation of respiratory complaints as outlined in the HPI above.  Her physical exam reveals pearly-gray tympanic membranes bilaterally with normal light reflex and clear external auditory canals.  Nasal mucosa is markedly edematous and erythematous with clear discharge in both nares.  Oropharyngeal exam reveals erythema to her soft palate.  Tonsils are surgically absent.  Posterior pharynx is erythematous with clear postnasal drip.  No cervical lymphadenopathy appreciable exam.  Cardiopulmonary exam reveals S1-S2 heart sounds with regular rate and rhythm and lung sounds are clear to auscultation in all fields.  Given her recent strep exposure I will swab patient for strep.  Her exam is consistent with upper respiratory infection, most likely viral.  Strep PCR is negative.  I will discharge patient home with a diagnosis of viral URI with cough and treat her symptoms with Atrovent nasal spray, Tessalon Perles, and Promethazine DM cough syrup.   Final Clinical Impressions(s) / UC Diagnoses   Final diagnoses:  Viral URI with cough     Discharge Instructions      Your strep test today was negative.  Your physical exam findings are consistent with a viral upper respiratory infection.  I Minna prescribe you some medication to help your symptoms.  Please take the below as directed.  Use the Atrovent nasal spray, 2 squirts in each nostril every 6 hours, as needed for runny nose and postnasal drip.  Use the Tessalon Perles every 8 hours during the day.  Take them with a small sip of water.  They may give you some numbness to the  base of your tongue or a metallic taste in your mouth, this is normal.  Use the Promethazine DM cough syrup at bedtime for cough and congestion.  It will make you drowsy so do not take it during the day.  Return for reevaluation or see your primary care provider for any new or worsening symptoms.      ED Prescriptions     Medication Sig Dispense Auth. Provider   benzonatate (TESSALON) 100 MG capsule Take 2 capsules (200 mg total) by mouth every 8 (eight) hours. 21 capsule Becky Augusta, NP   ipratropium (ATROVENT) 0.06 % nasal spray Place 2 sprays into both nostrils 4 (four) times daily. 15 mL Becky Augusta, NP   promethazine-dextromethorphan (PROMETHAZINE-DM) 6.25-15 MG/5ML syrup Take 5 mLs by mouth 4 (four) times daily as needed. 118 mL Becky Augusta, NP      PDMP not reviewed this encounter.   Becky Augusta, NP 04/01/22 1552

## 2022-04-01 NOTE — Discharge Instructions (Signed)
Your strep test today was negative.  Your physical exam findings are consistent with a viral upper respiratory infection.  I Minna prescribe you some medication to help your symptoms.  Please take the below as directed.  Use the Atrovent nasal spray, 2 squirts in each nostril every 6 hours, as needed for runny nose and postnasal drip.  Use the Tessalon Perles every 8 hours during the day.  Take them with a small sip of water.  They may give you some numbness to the base of your tongue or a metallic taste in your mouth, this is normal.  Use the Promethazine DM cough syrup at bedtime for cough and congestion.  It will make you drowsy so do not take it during the day.  Return for reevaluation or see your primary care provider for any new or worsening symptoms.

## 2022-04-01 NOTE — ED Triage Notes (Addendum)
Patient c/o sinus congestion and pressure, headache and bilateral ear pressure that started 3 days ago.  Patient denies fevers. Patient states that her kids had strep throat.  Patient does not want COVID test today.

## 2023-01-27 ENCOUNTER — Ambulatory Visit
Admission: EM | Admit: 2023-01-27 | Discharge: 2023-01-27 | Disposition: A | Discharge status: Home / Self Care | Payer: 59 | Attending: Physician Assistant | Admitting: Physician Assistant

## 2023-01-27 ENCOUNTER — Encounter: Payer: Self-pay | Admitting: Emergency Medicine

## 2023-01-27 DIAGNOSIS — U071 COVID-19: Secondary | ICD-10-CM | POA: Diagnosis present

## 2023-01-27 DIAGNOSIS — Z20822 Contact with and (suspected) exposure to covid-19: Secondary | ICD-10-CM

## 2023-01-27 LAB — SARS CORONAVIRUS 2 BY RT PCR: SARS Coronavirus 2 by RT PCR: POSITIVE — AB

## 2023-01-27 NOTE — Discharge Instructions (Signed)
-  Positive COVID. -Newest CDC guidelines are--you need to isolate until fever free >24 hours and symptoms are improving.  -You don't have to isolate. You may or may  not develop symptoms.  -Work from home over the next week. -If you develop symptoms use OTC meds.

## 2023-01-27 NOTE — ED Provider Notes (Signed)
[facility]    CSN: [id number] Arrival date & time: 01/27/23  1039      History   Chief Complaint Chief Complaint  Patient presents with   Covid Exposure     HPI Maria Zavala is a 31 y.o. female presenting for COVID testing.  She states her husband was diagnosed with COVID about a week ago.  States she think she might have COVID also but she denies any symptoms.  No fever, fatigue, body aches, cough congestion with sore throat, chest pain or shortness of breath.  Reports taking 2 at home test.  1 had a faint positive line and the other was negative.  HPI  Past Medical History:  Diagnosis Date   Asthma    Status post appendectomy 11/11/2016    There are no problems to display for this patient.   Past Surgical History:  Procedure Laterality Date   APPENDECTOMY     BREAST SURGERY     fibroid removed   CHOLECYSTECTOMY     TONSILLECTOMY      OB History   No obstetric history on file.      Home Medications    Prior to Admission medications   Medication Sig Start Date End Date Taking? Authorizing Provider  albuterol (VENTOLIN HFA) 108 (90 Base) MCG/ACT inhaler INHALE 1-2 PUFFS INTO THE LUNGS EVERY 6 (SIX) HOURS AS NEEDED FOR WHEEZING OR SHORTNESS OF BREATH. 10/24/16   [provider]  cetirizine (ZYRTEC) 10 MG tablet Take by mouth.    [provider]  hydrOXYzine (ATARAX) 25 MG tablet Take 1 to 2 tablets every 4 to 6 hours as needed for itching. 06/03/18   [provider]  lamoTRIgine (LAMICTAL) 200 MG tablet Take by mouth. 05/02/21 06/14/21  [provider]  levonorgestrel (PLAN B 1-STEP) 1.5 MG tablet TAKE 1 TABLET (1.5 MG TOTAL) BY MOUTH ONCE 04/14/18   [provider]  pantoprazole (PROTONIX) 40 MG tablet Take 40 mg by mouth daily. 12/21/20   [provider]    Family History History reviewed. No pertinent family history.  Social History Social History   Tobacco Use   Smoking status: Never    Smokeless tobacco: Never  Vaping Use   Vaping Use: Never used  Substance Use Topics   Alcohol use: Yes   Drug use: No     Allergies   Adhesive [tape]   Review of Systems Review of Systems  Constitutional:  Negative for chills, diaphoresis, fatigue and fever.  HENT:  Negative for congestion, ear pain, rhinorrhea, sinus pressure, sinus pain and sore throat.   Respiratory:  Negative for cough and shortness of breath.   Gastrointestinal:  Negative for abdominal pain, nausea and vomiting.  Musculoskeletal:  Negative for arthralgias and myalgias.  Skin:  Negative for rash.  Neurological:  Negative for weakness and headaches.  Hematological:  Negative for adenopathy.     Physical Exam Triage Vital Signs ED Triage Vitals  Enc Vitals Group     BP      Pulse      Resp      Temp      Temp src      SpO2      Weight      Height      Head Circumference      Peak Flow      Pain Score      Pain Loc      Pain Edu?      Excl. in GC?  No data found.  Updated Vital Signs BP 111/76 (BP Location: Right Arm)   Pulse 83   Temp 98.4 F (36.9 C) (Oral)   Resp 16   LMP 12/29/2022   SpO2 96%    Physical Exam Vitals and nursing note reviewed.  Constitutional:      General: She is not in acute distress.    Appearance: Normal appearance. She is not ill-appearing or toxic-appearing.  HENT:     Head: Normocephalic and atraumatic.     Nose: Nose normal.     Mouth/Throat:     Mouth: Mucous membranes are moist.     Pharynx: Oropharynx is clear.  Eyes:     General: No scleral icterus.       Right eye: No discharge.        Left eye: No discharge.     Conjunctiva/sclera: Conjunctivae normal.  Cardiovascular:     Rate and Rhythm: Normal rate and regular rhythm.     Heart sounds: Normal heart sounds.  Pulmonary:     Effort: Pulmonary effort is normal. No respiratory distress.     Breath sounds: Normal breath sounds.  Musculoskeletal:     Cervical back: Neck supple.  Skin:     General: Skin is dry.  Neurological:     General: No focal deficit present.     Mental Status: She is alert. Mental status is at baseline.     Motor: No weakness.     Gait: Gait normal.  Psychiatric:        Mood and Affect: Mood normal.        Behavior: Behavior normal.        Thought Content: Thought content normal.      UC Treatments / Results  Labs (all labs ordered are listed, but only abnormal results are displayed) Labs Reviewed  SARS CORONAVIRUS 2 BY RT PCR - Abnormal; Notable for the following components:      Result Value   SARS Coronavirus 2 by RT PCR POSITIVE (*)    All other components within normal limits    EKG   Radiology No results found.  Procedures Procedures (including critical care time)  Medications Ordered in UC Medications - No data to display  Initial Impression / Assessment and Plan / UC Course  I have reviewed the triage vital signs and the nursing notes.  Pertinent labs & imaging results that were available during my care of the patient were reviewed by me and considered in my medical decision making (see chart for details).   32 year old female presents for COVID testing after exposure from her husband.  Denies any symptoms.  Vitals are normal and stable.  Exam is benign.  COVID testing performed and positive.  Discussed results with patient.  Reviewed current CDC guidelines, isolation protocol and ED precautions.  Advised of symptomatic she may take OTC meds.  Patient given work note to work from home next week.   Final Clinical Impressions(s) / UC Diagnoses   Final diagnoses:  COVID-19  Exposure to COVID-19 virus     Discharge Instructions      -Positive COVID. -Newest CDC guidelines are--you need to isolate until fever free >24 hours and symptoms are improving.  -You don't have to isolate. You may or may  not develop symptoms.  -Work from home over the next week. -If you develop symptoms use OTC meds.     ED  Prescriptions   None    PDMP not reviewed this encounter.  Shirlee Latch, PA-C 01/27/23 1150

## 2023-01-27 NOTE — ED Triage Notes (Signed)
Pt would like to get tested for Covid. Herb husband was dx with Covid last night. Pt denies any symptoms.
